# Patient Record
Sex: Male | Born: 1935 | Race: White | Hispanic: No | Marital: Married | Smoking: Former smoker
Health system: Southern US, Community
[De-identification: ages and names within clinical notes are randomized; demographics above are authoritative.]

## PROBLEM LIST (undated history)

## (undated) DIAGNOSIS — C61 Malignant neoplasm of prostate: Secondary | ICD-10-CM

## (undated) DIAGNOSIS — K219 Gastro-esophageal reflux disease without esophagitis: Secondary | ICD-10-CM

## (undated) DIAGNOSIS — I10 Essential (primary) hypertension: Secondary | ICD-10-CM

## (undated) DIAGNOSIS — E785 Hyperlipidemia, unspecified: Secondary | ICD-10-CM

## (undated) HISTORY — DX: Hyperlipidemia, unspecified: E78.5

## (undated) HISTORY — DX: Essential (primary) hypertension: I10

## (undated) HISTORY — DX: Gastro-esophageal reflux disease without esophagitis: K21.9

## (undated) HISTORY — PX: SHOULDER SURGERY: SHX246

## (undated) HISTORY — PX: PROSTATE SURGERY: SHX751

## (undated) HISTORY — PX: ROTATOR CUFF REPAIR: SHX139

## (undated) HISTORY — DX: Malignant neoplasm of prostate: C61

---

## 1997-07-24 ENCOUNTER — Ambulatory Visit (HOSPITAL_COMMUNITY): Admission: RE | Admit: 1997-07-24 | Discharge: 1997-07-24 | Payer: Self-pay | Admitting: Gastroenterology

## 1997-10-23 ENCOUNTER — Ambulatory Visit (HOSPITAL_COMMUNITY): Admission: RE | Admit: 1997-10-23 | Discharge: 1997-10-23 | Payer: Self-pay | Admitting: Gastroenterology

## 1998-03-08 ENCOUNTER — Ambulatory Visit (HOSPITAL_COMMUNITY): Admission: RE | Admit: 1998-03-08 | Discharge: 1998-03-08 | Payer: Self-pay | Admitting: Gastroenterology

## 1999-12-10 ENCOUNTER — Ambulatory Visit (HOSPITAL_COMMUNITY): Admission: RE | Admit: 1999-12-10 | Discharge: 1999-12-10 | Payer: Self-pay | Admitting: Gastroenterology

## 2000-03-18 ENCOUNTER — Observation Stay (HOSPITAL_COMMUNITY): Admission: AD | Admit: 2000-03-18 | Discharge: 2000-03-18 | Payer: Self-pay | Admitting: Urology

## 2006-06-07 HISTORY — PX: CARDIOVASCULAR STRESS TEST: SHX262

## 2010-10-30 ENCOUNTER — Other Ambulatory Visit: Payer: Self-pay | Admitting: Cardiology

## 2010-10-30 NOTE — Telephone Encounter (Signed)
escribe medication per fax request  

## 2010-11-11 ENCOUNTER — Ambulatory Visit
Admission: RE | Admit: 2010-11-11 | Discharge: 2010-11-11 | Disposition: A | Discharge status: Home / Self Care | Payer: Medicare Other | Source: Ambulatory Visit | Attending: Family Medicine | Admitting: Family Medicine

## 2010-11-11 ENCOUNTER — Other Ambulatory Visit: Payer: Self-pay | Admitting: Family Medicine

## 2010-11-11 DIAGNOSIS — M25559 Pain in unspecified hip: Secondary | ICD-10-CM

## 2011-06-16 ENCOUNTER — Encounter: Payer: Self-pay | Admitting: *Deleted

## 2012-05-03 ENCOUNTER — Telehealth: Payer: Self-pay | Admitting: Family Medicine

## 2012-05-03 NOTE — Telephone Encounter (Signed)
Pt request refill atrovastatin.  Med list shows pt on simcor.  Chart states pravachol.  Record sent to provider for clarification.

## 2012-05-11 ENCOUNTER — Telehealth: Payer: Self-pay | Admitting: Family Medicine

## 2012-05-11 MED ORDER — ATENOLOL 25 MG PO TABS: 25.0000 mg | ORAL_TABLET | Freq: Every day | ORAL | Status: DC

## 2012-05-11 NOTE — Telephone Encounter (Signed)
rx refilled.

## 2012-09-02 ENCOUNTER — Ambulatory Visit (INDEPENDENT_AMBULATORY_CARE_PROVIDER_SITE_OTHER): Payer: Medicare Other | Admitting: Family Medicine

## 2012-09-02 ENCOUNTER — Encounter: Payer: Self-pay | Admitting: Family Medicine

## 2012-09-02 VITALS — BP 100/60 | HR 78 | Temp 97.9°F | Resp 16 | Wt 98.0 lb

## 2012-09-02 DIAGNOSIS — T7840XA Allergy, unspecified, initial encounter: Secondary | ICD-10-CM

## 2012-09-02 MED ORDER — PREDNISONE 20 MG PO TABS: ORAL_TABLET | ORAL | Status: DC

## 2012-09-02 NOTE — Progress Notes (Signed)
Subjective:    Patient ID: Todd Riley, male    DOB: 1929-01-26, 79 y.o.   MRN: [id number]  HPI Patient was stung behind his left knee approximately a week and a half ago by some type of flying insect. The area there now is swollen with a red inflammatory papule that itches.  He also complains of swelling and tenderness around the knee when he was stung  Past Medical History  Diagnosis Date  . Hypertension   . Hyperlipidemia   . GERD (gastroesophageal reflux disease)   . Prostate cancer    Current Outpatient Prescriptions on File Prior to Visit  Medication Sig Dispense Refill  . alendronate (FOSAMAX) 70 MG tablet Take 70 mg by mouth every 7 (seven) days. Take with a full glass of water on an empty stomach.      Marland Kitchen aspirin 81 MG tablet Take 81 mg by mouth daily.      Marland Kitchen atenolol (TENORMIN) 25 MG tablet Take 1 tablet (25 mg total) by mouth daily.  90 tablet  1  . calcium carbonate (OS-CAL) 600 MG TABS Take 600 mg by mouth 2 (two) times daily with a meal.      . DIOVAN HCT 160-25 MG per tablet TAKE 1 TABLET BY MOUTH EVERY DAY  30 tablet  3  . Multiple Vitamin (MULTIVITAMIN) tablet Take 1 tablet by mouth daily.      . multivitamin-lutein (OCUVITE-LUTEIN) CAPS Take 1 capsule by mouth daily.      . niacin-simvastatin (SIMCOR) 500-20 MG 24 hr tablet Take 1 tablet by mouth at bedtime.      Marland Kitchen omeprazole (PRILOSEC) 20 MG capsule Take 10 mg by mouth daily.      . psyllium (REGULOID) 0.52 G capsule Take 0.52 g by mouth 3 (three) times a week.       No current facility-administered medications on file prior to visit.   Allergies  Allergen Reactions  . Sulfa Antibiotics    History   Social History  . Marital Status: Single    Spouse Name: N/A    Number of Children: N/A  . Years of Education: N/A   Occupational History  . Not on file.   Social History Main Topics  . Smoking status: Former Smoker    Quit date: 06/16/1994  . Smokeless tobacco: Not on file  . Alcohol Use: No  . Drug  Use: No  . Sexually Active:    Other Topics Concern  . Not on file   Social History Narrative  . No narrative on file      Review of Systems  All other systems reviewed and are negative.       Objective:   Physical Exam  Vitals reviewed. Constitutional: He appears well-developed and well-nourished.  Cardiovascular: Normal rate and regular rhythm.   Pulmonary/Chest: Effort normal and breath sounds normal.  Skin: Skin is warm. Rash noted.   there is a 8 mm red inflammatory papule behind the left knee where the patient was initially stung there is also some surrounding edema. There is no evidence of cellulitis or abscess. The patient also has a corn or a small plantars wart between his fourth and fifth toes on his left foot. Which I recommended he apply Dr. Margart Sickles wart and corn remover to daily for the next 3 weeks to treat.        Assessment & Plan:  1. Allergic reaction, initial encounter Begin prednisone 20 mg tablets. He is to take 3 tablets on  days 1 and 2, 2 tablets on day 3 and 4, 1 tablet on day 5 and 6.  If the lesion persists consider a biopsy.

## 2012-09-27 ENCOUNTER — Encounter: Payer: Self-pay | Admitting: Family Medicine

## 2012-09-27 ENCOUNTER — Ambulatory Visit (INDEPENDENT_AMBULATORY_CARE_PROVIDER_SITE_OTHER): Payer: Medicare Other | Admitting: Family Medicine

## 2012-09-27 VITALS — BP 110/50 | HR 60 | Temp 97.9°F | Resp 14 | Wt 202.0 lb

## 2012-09-27 DIAGNOSIS — M25562 Pain in left knee: Secondary | ICD-10-CM

## 2012-09-27 DIAGNOSIS — M25569 Pain in unspecified knee: Secondary | ICD-10-CM

## 2012-09-27 MED ORDER — MELOXICAM 15 MG PO TABS: 15.0000 mg | ORAL_TABLET | Freq: Every day | ORAL | Status: DC

## 2012-09-27 NOTE — Progress Notes (Signed)
Subjective:    Patient ID: Todd Riley, male    DOB: 1928/10/06, 79 y.o.   MRN: [id number]  HPI Patient presents with a month of worsening left knee pain. It is primarily on the lateral aspect of his joint. It hurts with cramping and prolonged standing. He also reports occasional locking of the knee joint. He denies any joint laxity. It was irritated while he was walking [place] the beach on uneven sand.  He denies any erythema or effusion.  Past Medical History  Diagnosis Date  . Hypertension   . Hyperlipidemia   . GERD (gastroesophageal reflux disease)   . Prostate cancer    Current Outpatient Prescriptions on File Prior to Visit  Medication Sig Dispense Refill  . aspirin 81 MG tablet Take 81 mg by mouth daily.      Marland Kitchen atenolol (TENORMIN) 25 MG tablet Take 1 tablet (25 mg total) by mouth daily.  90 tablet  1  . calcium carbonate (OS-CAL) 600 MG TABS Take 600 mg by mouth 2 (two) times daily with a meal.      . DIOVAN HCT 160-25 MG per tablet TAKE 1 TABLET BY MOUTH EVERY DAY  30 tablet  3  . Multiple Vitamin (MULTIVITAMIN) tablet Take 1 tablet by mouth daily.      . multivitamin-lutein (OCUVITE-LUTEIN) CAPS Take 1 capsule by mouth daily.      Marland Kitchen omeprazole (PRILOSEC) 20 MG capsule Take 10 mg by mouth daily.      . psyllium (REGULOID) 0.52 G capsule Take 0.52 g by mouth 3 (three) times a week.      . Triptorelin Pamoate (TRELSTAR MIXJECT) 22.5 MG injection Inject 22.5 mg into the muscle every 6 (six) months.       No current facility-administered medications on file prior to visit.   Allergies  Allergen Reactions  . Sulfa Antibiotics    History   Social History  . Marital Status: Single    Spouse Name: N/A    Number of Children: N/A  . Years of Education: N/A   Occupational History  . Not on file.   Social History Main Topics  . Smoking status: Former Smoker    Quit date: 06/16/1994  . Smokeless tobacco: Not on file  . Alcohol Use: No  . Drug Use: No  . Sexually Active:     Other Topics Concern  . Not on file   Social History Narrative  . No narrative on file   No family history on file.    Review of Systems  All other systems reviewed and are negative.       Objective:   Physical Exam  Vitals reviewed. Cardiovascular: Normal rate and regular rhythm.   Pulmonary/Chest: Effort normal and breath sounds normal.  Musculoskeletal:       Left knee: He exhibits abnormal meniscus. He exhibits normal range of motion, no swelling, no effusion, normal alignment, no LCL laxity, normal patellar mobility and no MCL laxity. Tenderness found. Medial joint line and lateral joint line tenderness noted. No MCL, no LCL and no patellar tendon tenderness noted.          Assessment & Plan:  1. Knee pain, acute, left Discussed different options for treatment. I suspect arthritis compounded by lateral meniscal tear. Patient lacks meloxicam 15 mg by mouth daily. Recheck in 2 weeks. I recommended resting and icing the knee every day. If the knee is no better we can consider a cortisone injection versus obtaining an MRI to evaluate  for meniscal tear. - meloxicam (MOBIC) 15 MG tablet; Take 1 tablet (15 mg total) by mouth daily.  Dispense: 30 tablet; Refill: 0

## 2012-11-01 ENCOUNTER — Ambulatory Visit (INDEPENDENT_AMBULATORY_CARE_PROVIDER_SITE_OTHER): Payer: Medicare Other | Admitting: Family Medicine

## 2012-11-01 DIAGNOSIS — Z23 Encounter for immunization: Secondary | ICD-10-CM

## 2012-11-07 ENCOUNTER — Other Ambulatory Visit: Payer: Self-pay | Admitting: Family Medicine

## 2012-11-07 NOTE — Telephone Encounter (Signed)
Medication refilled per protocol. 

## 2012-11-28 ENCOUNTER — Encounter: Payer: Medicare Other | Admitting: Family Medicine

## 2012-11-29 ENCOUNTER — Other Ambulatory Visit: Payer: Medicare Other

## 2012-11-29 DIAGNOSIS — M810 Age-related osteoporosis without current pathological fracture: Secondary | ICD-10-CM

## 2012-11-29 DIAGNOSIS — Z8546 Personal history of malignant neoplasm of prostate: Secondary | ICD-10-CM

## 2012-11-29 DIAGNOSIS — E785 Hyperlipidemia, unspecified: Secondary | ICD-10-CM

## 2012-11-29 DIAGNOSIS — Z79899 Other long term (current) drug therapy: Secondary | ICD-10-CM

## 2012-11-29 DIAGNOSIS — I10 Essential (primary) hypertension: Secondary | ICD-10-CM

## 2012-11-29 DIAGNOSIS — Z0000 Encounter for general adult medical examination without abnormal findings: Secondary | ICD-10-CM

## 2012-11-29 LAB — COMPLETE METABOLIC PANEL WITH GFR
ALT: 21 U/L (ref 0–53)
Albumin: 4 g/dL (ref 3.5–5.2)
Alkaline Phosphatase: 40 U/L (ref 39–117)
BUN: 22 mg/dL (ref 6–23)
GFR, Est African American: 60 mL/min
GFR, Est Non African American: 52 mL/min — ABNORMAL LOW
Glucose, Bld: 92 mg/dL (ref 70–99)
Potassium: 5 mEq/L (ref 3.5–5.3)
Sodium: 138 mEq/L (ref 135–145)
Total Protein: 6.6 g/dL (ref 6.0–8.3)

## 2012-11-29 LAB — CBC WITH DIFFERENTIAL/PLATELET
Basophils Absolute: 0.1 10*3/uL (ref 0.0–0.1)
Basophils Relative: 1 % (ref 0–1)
HCT: 39.4 % (ref 39.0–52.0)
Hemoglobin: 13.5 g/dL (ref 13.0–17.0)
Lymphocytes Relative: 21 % (ref 12–46)
Monocytes Absolute: 0.9 10*3/uL (ref 0.1–1.0)
Neutro Abs: 4.2 10*3/uL (ref 1.7–7.7)
Neutrophils Relative %: 60 % (ref 43–77)
RDW: 12.8 % (ref 11.5–15.5)
WBC: 7 10*3/uL (ref 4.0–10.5)

## 2012-11-29 LAB — TSH: TSH: 0.921 u[IU]/mL (ref 0.350–4.500)

## 2012-11-29 LAB — LIPID PANEL
HDL: 43 mg/dL (ref >39)
LDL Cholesterol: 115 mg/dL — ABNORMAL HIGH (ref 0–99)
VLDL: 22 mg/dL (ref 0–40)

## 2012-11-29 LAB — PSA, MEDICARE: PSA: 0.24 ng/mL (ref <=4.00)

## 2012-12-08 ENCOUNTER — Ambulatory Visit (INDEPENDENT_AMBULATORY_CARE_PROVIDER_SITE_OTHER): Payer: Medicare Other | Admitting: Family Medicine

## 2012-12-08 ENCOUNTER — Encounter: Payer: Self-pay | Admitting: Family Medicine

## 2012-12-08 VITALS — BP 120/80 | HR 78 | Temp 97.1°F | Resp 18 | Ht 70.0 in | Wt 198.0 lb

## 2012-12-08 DIAGNOSIS — Z0000 Encounter for general adult medical examination without abnormal findings: Secondary | ICD-10-CM

## 2012-12-08 NOTE — Progress Notes (Signed)
Subjective:    Patient ID: Todd Riley, male    DOB: 1929/01/21, 79 y.o.   MRN: [id number]  HPI Patient is here for complete physical exam. He has no major medical concerns. The patient has had a pneumonia vaccine. He does have a Zostavax. His last tetanus shot was in 2005. He received his flu shot this morning. He has a history of prostate cancer. His PSA was greater than 6 in September 2013. At that time he switched to trelstar.  Since then his PSA has been 0.14 in January and is 0.24 today in the office. Otherwise he is asymptomatic and feeling well. His most recent lab work is listed: Lab on 11/29/2012  Component Date Value Range Status  . Cholesterol 11/29/2012 180  0 - 200 mg/dL Final   Comment: ATP III Classification:                                < 200        mg/dL        Desirable                               200 - 239     mg/dL        Borderline High                               >= 240        mg/dL        High                             . Triglycerides 11/29/2012 109  <150 mg/dL Final  . HDL 47/42/5956 43  >39 mg/dL Final  . Total CHOL/HDL Ratio 11/29/2012 4.2   Final  . VLDL 11/29/2012 22  0 - 40 mg/dL Final  . LDL Cholesterol 11/29/2012 115* 0 - 99 mg/dL Final   Comment:                            Total Cholesterol/HDL Ratio:CHD Risk                                                 Coronary Heart Disease Risk Table                                                                 Men       Women                                   1/2 Average Risk              3.4        3.3  Average Risk              5.0        4.4                                    2X Average Risk              9.6        7.1                                    3X Average Risk             23.4       11.0                          Use the calculated Patient Ratio above and the CHD Risk table                           to determine the patient's CHD Risk.         ATP III Classification (LDL):                                < 100        mg/dL         Optimal                               100 - 129     mg/dL         Near or Above Optimal                               130 - 159     mg/dL         Borderline High                               160 - 189     mg/dL         High                                > 190        mg/dL         Very High                             . WBC 11/29/2012 7.0  4.0 - 10.5 K/uL Final  . RBC 11/29/2012 4.44  4.22 - 5.81 MIL/uL Final  . Hemoglobin 11/29/2012 13.5  13.0 - 17.0 g/dL Final  . HCT 24/40/1027 39.4  39.0 - 52.0 % Final  . MCV 11/29/2012 88.7  78.0 - 100.0 fL Final  . MCH 11/29/2012 30.4  26.0 - 34.0 pg Final  . MCHC 11/29/2012 34.3  30.0 - 36.0 g/dL Final  . RDW 25/36/6440 12.8  11.5 - 15.5 % Final  . Platelets 11/29/2012 195  150 - 400 K/uL Final  . Neutrophils Relative % 11/29/2012  60  43 - 77 % Final  . Neutro Abs 11/29/2012 4.2  1.7 - 7.7 K/uL Final  . Lymphocytes Relative 11/29/2012 21  12 - 46 % Final  . Lymphs Abs 11/29/2012 1.4  0.7 - 4.0 K/uL Final  . Monocytes Relative 11/29/2012 12  3 - 12 % Final  . Monocytes Absolute 11/29/2012 0.9  0.1 - 1.0 K/uL Final  . Eosinophils Relative 11/29/2012 6* 0 - 5 % Final  . Eosinophils Absolute 11/29/2012 0.4  0.0 - 0.7 K/uL Final  . Basophils Relative 11/29/2012 1  0 - 1 % Final  . Basophils Absolute 11/29/2012 0.1  0.0 - 0.1 K/uL Final  . Smear Review 11/29/2012 Criteria for review not met   Final  . TSH 11/29/2012 0.921  0.350 - 4.500 uIU/mL Final  . Vit D, 25-Hydroxy 11/29/2012 52  30 - 89 ng/mL Final   Comment: This assay accurately quantifies Vitamin D, which is the sum of the                          25-Hydroxy forms of Vitamin D2 and D3.  Studies have shown that the                          optimum concentration of 25-Hydroxy Vitamin D is 30 ng/mL or higher.                           Concentrations of Vitamin D between 20 and 29 ng/mL are considered  to                          be insufficient and concentrations less than 20 ng/mL are considered                          to be deficient for Vitamin D.  . PSA 11/29/2012 0.24  <=4.00 ng/mL Final   Comment: Result repeated and verified.                          Test Methodology: ECLIA PSA (Electrochemiluminescence Immunoassay)                                                     For PSA values from 2.5-4.0, particularly in younger men <60 years                          old, the AUA and NCCN suggest testing for % Free PSA (3515) and                          evaluation of the rate of increase in PSA (PSA velocity).  . Sodium 11/29/2012 138  135 - 145 mEq/L Final  . Potassium 11/29/2012 5.0  3.5 - 5.3 mEq/L Final  . Chloride 11/29/2012 102  96 - 112 mEq/L Final  . CO2 11/29/2012 32  19 - 32 mEq/L Final  . Glucose, Bld 11/29/2012 92  70 - 99 mg/dL Final  . BUN 16/11/9602 22  6 - 23 mg/dL Final  . Creat 54/10/8117 1.27  0.50 - 1.35 mg/dL Final  . Total Bilirubin 11/29/2012 0.6  0.3 - 1.2 mg/dL Final  . Alkaline Phosphatase 11/29/2012 40  39 - 117 U/L Final  . AST 11/29/2012 28  0 - 37 U/L Final  . ALT 11/29/2012 21  0 - 53 U/L Final  . Total Protein 11/29/2012 6.6  6.0 - 8.3 g/dL Final  . Albumin 16/11/9602 4.0  3.5 - 5.2 g/dL Final  . Calcium 54/10/8117 9.5  8.4 - 10.5 mg/dL Final  . GFR, Est African American 11/29/2012 60   Final  . GFR, Est Non African American 11/29/2012 52*  Final   Comment:                            The estimated GFR is a calculation valid for adults (>=60 years old)                          that uses the CKD-EPI algorithm to adjust for age and sex. It is                            not to be used for children, pregnant women, hospitalized patients,                             patients on dialysis, or with rapidly changing kidney function.                          According to the NKDEP, eGFR >89 is normal, 60-89 shows mild                          impairment, 30-59  shows moderate impairment, 15-29 shows severe                          impairment and <15 is ESRD.                              Past Medical History  Diagnosis Date  . Hypertension   . Hyperlipidemia   . GERD (gastroesophageal reflux disease)   . Prostate cancer    Past Surgical History  Procedure Laterality Date  . Rotator cuff repair    . Shoulder surgery    . Prostate surgery    . Cardiovascular stress test  06/07/2006    ef 56%, no ischemia   Current Outpatient Prescriptions on File Prior to Visit  Medication Sig Dispense Refill  . aspirin 81 MG tablet Take 81 mg by mouth daily.      Marland Kitchen atenolol (TENORMIN) 25 MG tablet TAKE 1 TABLET EVERY DAY  90 tablet  1  . calcium carbonate (OS-CAL) 600 MG TABS Take 600 mg by mouth 2 (two) times daily with a meal.      . DIOVAN HCT 160-25 MG per tablet TAKE 1 TABLET BY MOUTH EVERY DAY  30 tablet  3  . ezetimibe (ZETIA) 10 MG tablet Take 10 mg by mouth daily.      . Multiple Vitamin (MULTIVITAMIN) tablet Take 1 tablet by mouth daily.      . multivitamin-lutein (OCUVITE-LUTEIN) CAPS Take 1 capsule by mouth daily.      Marland Kitchen  omeprazole (PRILOSEC) 20 MG capsule Take 10 mg by mouth daily.      . psyllium (REGULOID) 0.52 G capsule Take 0.52 g by mouth 3 (three) times a week.      . Triptorelin Pamoate (TRELSTAR MIXJECT) 22.5 MG injection Inject 22.5 mg into the muscle every 6 (six) months.       No current facility-administered medications on file prior to visit.   Allergies  Allergen Reactions  . Sulfa Antibiotics    History   Social History  . Marital Status: Single    Spouse Name: N/A    Number of Children: N/A  . Years of Education: N/A   Occupational History  . Not on file.   Social History Main Topics  . Smoking status: Former Smoker    Quit date: 06/16/1994  . Smokeless tobacco: Not on file  . Alcohol Use: No  . Drug Use: No  . Sexual Activity:    Other Topics Concern  . Not on file   Social History Narrative  . No  narrative on file   Family history is noncontributory. The patient is married to [place].  He is retired.     Review of Systems  All other systems reviewed and are negative.       Objective:   Physical Exam  Vitals reviewed. Constitutional: He is oriented to person, place, and time. He appears well-developed and well-nourished. No distress.  HENT:  Head: Normocephalic and atraumatic.  Right Ear: External ear normal.  Left Ear: External ear normal.  Nose: Nose normal.  Mouth/Throat: Oropharynx is clear and moist. No oropharyngeal exudate.  Eyes: Conjunctivae and EOM are normal. Pupils are equal, round, and reactive to light. Right eye exhibits no discharge. Left eye exhibits no discharge. No scleral icterus.  Neck: Normal range of motion. Neck supple. No JVD present. No tracheal deviation present. No thyromegaly present.  Cardiovascular: Normal rate, regular rhythm, normal heart sounds and intact distal pulses.  Exam reveals no gallop and no friction rub.   No murmur heard. Pulmonary/Chest: Effort normal and breath sounds normal. No stridor. No respiratory distress. He has no wheezes. He has no rales. He exhibits no tenderness.  Abdominal: Soft. Bowel sounds are normal. He exhibits no distension and no mass. There is no tenderness. There is no rebound and no guarding.  Musculoskeletal: Normal range of motion. He exhibits no edema and no tenderness.  Lymphadenopathy:    He has no cervical adenopathy.  Neurological: He is alert and oriented to person, place, and time. He has normal reflexes. He displays normal reflexes. No cranial nerve deficit. He exhibits normal muscle tone. Coordination normal.  Skin: Skin is warm. No rash noted. He is not diaphoretic. There is erythema. No pallor.  Psychiatric: He has a normal mood and affect. His behavior is normal. Judgment and thought content normal.          Assessment & Plan:  1. Routine general medical examination at a health care  facility Patient's physical exam and lab work and blood pressure are excellent. His immunizations are up-to-date. His cancer screening is up to date. I do see 2 actinic keratoses versus squamous cell carcinomas on his left temple. I recommended he return at a time this convenient for him for cryotherapy. He states that he would like to do this in the future. Otherwise general preventative care was discussed including healthy diet, and regular aerobic exercise.

## 2012-12-26 ENCOUNTER — Other Ambulatory Visit: Payer: Self-pay | Admitting: Family Medicine

## 2013-01-05 ENCOUNTER — Ambulatory Visit (INDEPENDENT_AMBULATORY_CARE_PROVIDER_SITE_OTHER): Payer: Medicare Other | Admitting: Family Medicine

## 2013-01-05 ENCOUNTER — Encounter: Payer: Self-pay | Admitting: Family Medicine

## 2013-01-05 VITALS — BP 110/60 | HR 72 | Temp 97.6°F | Resp 16 | Ht 71.0 in | Wt 201.0 lb

## 2013-01-05 DIAGNOSIS — D485 Neoplasm of uncertain behavior of skin: Secondary | ICD-10-CM

## 2013-01-05 NOTE — Progress Notes (Signed)
Subjective:    Patient ID: Todd Riley, male    DOB: 1928-11-15, 79 y.o.   MRN: [id number]  HPI  Patient presents today concerned about 3 lesions on his skin. 2 on his left temple. One is on his right forearm. There is a 1 cm erythematous scaly papule on his left temple. There is a 1 cm erythematous scaly macule on his left temple.  There is a 7 mm erythematous scaly papule on his right forearm. The lesions are not itchy, they're not painful, they do not bleed. Past Medical History  Diagnosis Date  . Hypertension   . Hyperlipidemia   . GERD (gastroesophageal reflux disease)   . Prostate cancer    Past Surgical History  Procedure Laterality Date  . Rotator cuff repair    . Shoulder surgery    . Prostate surgery    . Cardiovascular stress test  06/07/2006    ef 56%, no ischemia   Current Outpatient Prescriptions on File Prior to Visit  Medication Sig Dispense Refill  . aspirin 81 MG tablet Take 81 mg by mouth daily.      Marland Kitchen atenolol (TENORMIN) 25 MG tablet TAKE 1 TABLET EVERY DAY  90 tablet  1  . calcium carbonate (OS-CAL) 600 MG TABS Take 600 mg by mouth 2 (two) times daily with a meal.      . ezetimibe (ZETIA) 10 MG tablet Take 10 mg by mouth daily.      . Multiple Vitamin (MULTIVITAMIN) tablet Take 1 tablet by mouth daily.      . multivitamin-lutein (OCUVITE-LUTEIN) CAPS Take 1 capsule by mouth daily.      Marland Kitchen omeprazole (PRILOSEC) 20 MG capsule Take 10 mg by mouth daily.      . psyllium (REGULOID) 0.52 G capsule Take 0.52 g by mouth 3 (three) times a week.      . Triptorelin Pamoate (TRELSTAR MIXJECT) 22.5 MG injection Inject 22.5 mg into the muscle every 6 (six) months.      . valsartan-hydrochlorothiazide (DIOVAN-HCT) 160-25 MG per tablet TAKE 1 TABLET BY MOUTH DAILY  90 tablet  3   No current facility-administered medications on file prior to visit.   Allergies  Allergen Reactions  . Sulfa Antibiotics    History   Social History  . Marital Status: Single    Spouse  Name: N/A    Number of Children: N/A  . Years of Education: N/A   Occupational History  . Not on file.   Social History Main Topics  . Smoking status: Former Smoker    Quit date: 06/16/1994  . Smokeless tobacco: Not on file  . Alcohol Use: No  . Drug Use: No  . Sexual Activity:    Other Topics Concern  . Not on file   Social History Narrative  . No narrative on file     Review of Systems  All other systems reviewed and are negative.       Objective:   Physical Exam  Vitals reviewed. Cardiovascular: Normal rate and regular rhythm.   Pulmonary/Chest: Effort normal and breath sounds normal.   please see the description of the lesions in the history of present illness        Assessment & Plan:  1. Neoplasm of uncertain behavior of skin I am concerned all 3 are actinic keratoses versus early squamous cell carcinomas. All 3 lesions were each treated with liquid nitrogen cryotherapy for a total of 30 seconds each. The patient was informed that if the  lesions persist greater than one month I would consult dermatology for surgical excision

## 2013-04-30 ENCOUNTER — Other Ambulatory Visit: Payer: Self-pay | Admitting: Family Medicine

## 2013-10-26 ENCOUNTER — Other Ambulatory Visit: Payer: Self-pay | Admitting: Family Medicine

## 2013-11-09 ENCOUNTER — Other Ambulatory Visit (HOSPITAL_COMMUNITY): Payer: Self-pay | Admitting: Urology

## 2013-11-09 DIAGNOSIS — C61 Malignant neoplasm of prostate: Secondary | ICD-10-CM

## 2013-11-13 ENCOUNTER — Ambulatory Visit: Payer: Medicare Other | Admitting: *Deleted

## 2013-11-13 VITALS — BP 130/68 | HR 62 | Temp 98.3°F | Resp 14

## 2013-11-13 DIAGNOSIS — I10 Essential (primary) hypertension: Secondary | ICD-10-CM | POA: Insufficient documentation

## 2013-11-13 NOTE — Progress Notes (Signed)
Patient ID: Todd Riley, male   DOB: 09/11/1928, 80 y.o.   MRN: [id number] Patient seen in office to have BP checked.   Reports that he was in drug store on 11/12/2013 and checked his BP with automated cuff. Reports that reading was 94/55. Denies any dizziness or feelings of weakness.   BP noted 130/68 today. Reports that that is near his baseline.   Advised to retrn or contact office if BP drops or if he has any unexplained weakness or dizziness.

## 2013-11-22 ENCOUNTER — Encounter (HOSPITAL_COMMUNITY): Payer: Medicare Other

## 2013-11-27 ENCOUNTER — Encounter (HOSPITAL_COMMUNITY)
Admission: RE | Admit: 2013-11-27 | Discharge: 2013-11-27 | Disposition: A | Discharge status: Home / Self Care | Payer: Medicare Other | Source: Ambulatory Visit | Attending: Diagnostic Radiology | Admitting: Diagnostic Radiology

## 2013-11-27 ENCOUNTER — Encounter (HOSPITAL_COMMUNITY)
Admission: RE | Admit: 2013-11-27 | Discharge: 2013-11-27 | Disposition: A | Discharge status: Home / Self Care | Payer: Medicare Other | Source: Ambulatory Visit | Attending: Urology | Admitting: Urology

## 2013-11-27 DIAGNOSIS — C61 Malignant neoplasm of prostate: Secondary | ICD-10-CM | POA: Insufficient documentation

## 2013-11-27 DIAGNOSIS — R972 Elevated prostate specific antigen [PSA]: Secondary | ICD-10-CM | POA: Insufficient documentation

## 2013-11-27 MED ORDER — TECHNETIUM TC 99M MEDRONATE IV KIT
24.7000 | PACK | Freq: Once | INTRAVENOUS | Status: AC | PRN
  Administered 2013-11-27: 24.7 via INTRAVENOUS

## 2013-11-28 ENCOUNTER — Other Ambulatory Visit: Payer: Self-pay | Admitting: Family Medicine

## 2013-12-07 ENCOUNTER — Other Ambulatory Visit: Payer: Medicare Other

## 2013-12-07 DIAGNOSIS — E785 Hyperlipidemia, unspecified: Secondary | ICD-10-CM

## 2013-12-07 DIAGNOSIS — Z0000 Encounter for general adult medical examination without abnormal findings: Secondary | ICD-10-CM

## 2013-12-07 DIAGNOSIS — Z79899 Other long term (current) drug therapy: Secondary | ICD-10-CM

## 2013-12-07 DIAGNOSIS — Z8546 Personal history of malignant neoplasm of prostate: Secondary | ICD-10-CM

## 2013-12-07 DIAGNOSIS — K219 Gastro-esophageal reflux disease without esophagitis: Secondary | ICD-10-CM

## 2013-12-07 DIAGNOSIS — I10 Essential (primary) hypertension: Secondary | ICD-10-CM

## 2013-12-07 LAB — COMPLETE METABOLIC PANEL WITH GFR
ALBUMIN: 3.9 g/dL (ref 3.5–5.2)
ALT: 17 U/L (ref 0–53)
AST: 27 U/L (ref 0–37)
Alkaline Phosphatase: 38 U/L — ABNORMAL LOW (ref 39–117)
BUN: 22 mg/dL (ref 6–23)
CALCIUM: 9.4 mg/dL (ref 8.4–10.5)
CHLORIDE: 102 meq/L (ref 96–112)
CO2: 29 mEq/L (ref 19–32)
Creat: 1.38 mg/dL — ABNORMAL HIGH (ref 0.50–1.35)
GFR, Est African American: 54 mL/min — ABNORMAL LOW
GFR, Est Non African American: 47 mL/min — ABNORMAL LOW
Glucose, Bld: 90 mg/dL (ref 70–99)
POTASSIUM: 4.7 meq/L (ref 3.5–5.3)
Sodium: 139 mEq/L (ref 135–145)
Total Bilirubin: 0.7 mg/dL (ref 0.2–1.2)
Total Protein: 6.8 g/dL (ref 6.0–8.3)

## 2013-12-07 LAB — TSH: TSH: 1.097 u[IU]/mL (ref 0.350–4.500)

## 2013-12-07 LAB — CBC WITH DIFFERENTIAL/PLATELET
BASOS PCT: 1 % (ref 0–1)
Basophils Absolute: 0.1 10*3/uL (ref 0.0–0.1)
EOS ABS: 0.3 10*3/uL (ref 0.0–0.7)
Eosinophils Relative: 5 % (ref 0–5)
HCT: 39.5 % (ref 39.0–52.0)
Hemoglobin: 13.2 g/dL (ref 13.0–17.0)
Lymphocytes Relative: 21 % (ref 12–46)
Lymphs Abs: 1.4 10*3/uL (ref 0.7–4.0)
MCH: 30.1 pg (ref 26.0–34.0)
MCHC: 33.4 g/dL (ref 30.0–36.0)
MCV: 90.2 fL (ref 78.0–100.0)
Monocytes Absolute: 0.6 10*3/uL (ref 0.1–1.0)
Monocytes Relative: 9 % (ref 3–12)
NEUTROS PCT: 64 % (ref 43–77)
Neutro Abs: 4.2 10*3/uL (ref 1.7–7.7)
Platelets: 216 10*3/uL (ref 150–400)
RBC: 4.38 MIL/uL (ref 4.22–5.81)
RDW: 13.3 % (ref 11.5–15.5)
WBC: 6.5 10*3/uL (ref 4.0–10.5)

## 2013-12-07 LAB — LIPID PANEL
CHOLESTEROL: 186 mg/dL (ref 0–200)
HDL: 35 mg/dL — ABNORMAL LOW (ref >39)
LDL Cholesterol: 122 mg/dL — ABNORMAL HIGH (ref 0–99)
Total CHOL/HDL Ratio: 5.3 Ratio
Triglycerides: 144 mg/dL (ref <150)
VLDL: 29 mg/dL (ref 0–40)

## 2013-12-08 LAB — VITAMIN D 25 HYDROXY (VIT D DEFICIENCY, FRACTURES): VIT D 25 HYDROXY: 55 ng/mL (ref 30–89)

## 2013-12-08 LAB — PSA, MEDICARE: PSA: 11.47 ng/mL — ABNORMAL HIGH (ref <=4.00)

## 2013-12-11 ENCOUNTER — Other Ambulatory Visit: Payer: Medicare Other

## 2013-12-14 ENCOUNTER — Encounter: Payer: Self-pay | Admitting: Family Medicine

## 2013-12-14 ENCOUNTER — Ambulatory Visit (INDEPENDENT_AMBULATORY_CARE_PROVIDER_SITE_OTHER): Payer: Medicare Other | Admitting: Family Medicine

## 2013-12-14 VITALS — BP 110/58 | HR 60 | Temp 97.5°F | Resp 14 | Ht 71.0 in | Wt 201.0 lb

## 2013-12-14 DIAGNOSIS — Z0000 Encounter for general adult medical examination without abnormal findings: Secondary | ICD-10-CM

## 2013-12-14 NOTE — Progress Notes (Signed)
Subjective:    Patient ID: Todd Riley, male    DOB: 1928-04-20, 80 y.o.   MRN: [id number]  HPI Subjective:   Patient presents for Medicare Annual/Subsequent preventive examination.  Patient's PSA has recently risen. He has a history of prostate cancer. He has decided with his urologist to resume lupron injections. His most recent lab work as listed below: Lab on 12/07/2013  Component Date Value Ref Range Status  . WBC 12/07/2013 6.5  4.0 - 10.5 K/uL Final  . RBC 12/07/2013 4.38  4.22 - 5.81 MIL/uL Final  . Hemoglobin 12/07/2013 13.2  13.0 - 17.0 g/dL Final  . HCT 12/07/2013 39.5  39.0 - 52.0 % Final  . MCV 12/07/2013 90.2  78.0 - 100.0 fL Final  . MCH 12/07/2013 30.1  26.0 - 34.0 pg Final  . MCHC 12/07/2013 33.4  30.0 - 36.0 g/dL Final  . RDW 12/07/2013 13.3  11.5 - 15.5 % Final  . Platelets 12/07/2013 216  150 - 400 K/uL Final  . Neutrophils Relative % 12/07/2013 64  43 - 77 % Final  . Neutro Abs 12/07/2013 4.2  1.7 - 7.7 K/uL Final  . Lymphocytes Relative 12/07/2013 21  12 - 46 % Final  . Lymphs Abs 12/07/2013 1.4  0.7 - 4.0 K/uL Final  . Monocytes Relative 12/07/2013 9  3 - 12 % Final  . Monocytes Absolute 12/07/2013 0.6  0.1 - 1.0 K/uL Final  . Eosinophils Relative 12/07/2013 5  0 - 5 % Final  . Eosinophils Absolute 12/07/2013 0.3  0.0 - 0.7 K/uL Final  . Basophils Relative 12/07/2013 1  0 - 1 % Final  . Basophils Absolute 12/07/2013 0.1  0.0 - 0.1 K/uL Final  . Smear Review 12/07/2013 Criteria for review not met   Final  . Cholesterol 12/07/2013 186  0 - 200 mg/dL Final   Comment: ATP III Classification:                                < 200        mg/dL        Desirable                               200 - 239     mg/dL        Borderline High                               >= 240        mg/dL        High                             . Triglycerides 12/07/2013 144  <150 mg/dL Final  . HDL 12/07/2013 35* >39 mg/dL Final  . Total CHOL/HDL Ratio 12/07/2013 5.3   Final  .  VLDL 12/07/2013 29  0 - 40 mg/dL Final  . LDL Cholesterol 12/07/2013 122* 0 - 99 mg/dL Final   Comment:                            Total Cholesterol/HDL Ratio:CHD Risk  Coronary Heart Disease Risk Table                                                                 Men       Women                                   1/2 Average Risk              3.4        3.3                                       Average Risk              5.0        4.4                                    2X Average Risk              9.6        7.1                                    3X Average Risk             23.4       11.0                          Use the calculated Patient Ratio above and the CHD Risk table                           to determine the patient's CHD Risk.                          ATP III Classification (LDL):                                < 100        mg/dL         Optimal                               100 - 129     mg/dL         Near or Above Optimal                               130 - 159     mg/dL         Borderline High                               160 - 189     mg/dL           High                                > 190        mg/dL         Very High                             . PSA 12/07/2013 11.47* <=4.00 ng/mL Final   Comment: Test Methodology: ECLIA PSA (Electrochemiluminescence Immunoassay)                                                     For PSA values from 2.5-4.0, particularly in younger men <60 years                          old, the AUA and NCCN suggest testing for % Free PSA (3515) and                          evaluation of the rate of increase in PSA (PSA velocity).  . TSH 12/07/2013 1.097  0.350 - 4.500 uIU/mL Final  . Vit D, 25-Hydroxy 12/07/2013 55  30 - 89 ng/mL Final   Comment: This assay accurately quantifies Vitamin D, which is the sum of the                          25-Hydroxy forms of Vitamin D2 and D3.  Studies have shown that the                           optimum concentration of 25-Hydroxy Vitamin D is 30 ng/mL or higher.                           Concentrations of Vitamin D between 20 and 29 ng/mL are considered to                          be insufficient and concentrations less than 20 ng/mL are considered                          to be deficient for Vitamin D.  . Sodium 12/07/2013 139  135 - 145 mEq/L Final  . Potassium 12/07/2013 4.7  3.5 - 5.3 mEq/L Final  . Chloride 12/07/2013 102  96 - 112 mEq/L Final  . CO2 12/07/2013 29  19 - 32 mEq/L Final  . Glucose, Bld 12/07/2013 90  70 - 99 mg/dL Final  . BUN 59/43/9621 22  6 - 23 mg/dL Final  . Creat 81/96/8279 1.38* 0.50 - 1.35 mg/dL Final  . Total Bilirubin 12/07/2013 0.7  0.2 - 1.2 mg/dL Final  . Alkaline Phosphatase 12/07/2013 38* 39 - 117 U/L Final  . AST 12/07/2013 27  0 - 37 U/L Final  . ALT 12/07/2013 17  0 - 53 U/L Final  . Total Protein 12/07/2013 6.8  6.0 - 8.3 g/dL Final  . Albumin 96/57/3801 3.9  3.5 - 5.2 g/dL Final  . Calcium 30/17/5120 9.4  8.4 - 10.5 mg/dL Final  . GFR, Est African American 12/07/2013 54*  Final  . GFR, Est Non African American 12/07/2013 47*  Final   Comment:                            The estimated GFR is a calculation valid for adults (>=38 years old)                          that uses the CKD-EPI algorithm to adjust for age and sex. It is                            not to be used for children, pregnant women, hospitalized patients,                             patients on dialysis, or with rapidly changing kidney function.                          According to the NKDEP, eGFR >89 is normal, 60-89 shows mild                          impairment, 30-59 shows moderate impairment, 15-29 shows severe                          impairment and <15 is ESRD.                                 Review Past Medical/Family/Social:  Past Medical History  Diagnosis Date  . Hypertension   . Hyperlipidemia   . GERD (gastroesophageal reflux  disease)   . Prostate cancer    Past Surgical History  Procedure Laterality Date  . Rotator cuff repair    . Shoulder surgery    . Prostate surgery    . Cardiovascular stress test  06/07/2006    ef 56%, no ischemia   Current Outpatient Prescriptions on File Prior to Visit  Medication Sig Dispense Refill  . aspirin 81 MG tablet Take 81 mg by mouth daily.      Marland Kitchen atenolol (TENORMIN) 25 MG tablet TAKE 1 TABLET BY MOUTH DAILY  30 tablet  1  . calcium carbonate (OS-CAL) 600 MG TABS Take 600 mg by mouth 2 (two) times daily with a meal.      . ezetimibe (ZETIA) 10 MG tablet Take 10 mg by mouth daily.      . Multiple Vitamin (MULTIVITAMIN) tablet Take 1 tablet by mouth daily.      . multivitamin-lutein (OCUVITE-LUTEIN) CAPS Take 1 capsule by mouth daily.      Marland Kitchen omeprazole (PRILOSEC) 20 MG capsule Take 10 mg by mouth daily.      . psyllium (REGULOID) 0.52 G capsule Take 0.52 g by mouth 3 (three) times a week.      . valsartan-hydrochlorothiazide (DIOVAN-HCT) 160-25 MG per tablet TAKE 1 TABLET BY MOUTH DAILY  90 tablet  3   No current facility-administered medications on file prior to visit.   Allergies  Allergen Reactions  . Sulfa Antibiotics    History   Social History  . Marital Status: Single    Spouse  Name: N/A    Number of Children: N/A  . Years of Education: N/A   Occupational History  . Not on file.   Social History Main Topics  . Smoking status: Former Smoker    Quit date: 06/16/1994  . Smokeless tobacco: Not on file  . Alcohol Use: No  . Drug Use: No  . Sexual Activity:    Other Topics Concern  . Not on file   Social History Narrative  . No narrative on file   No family history on file.  Depression Screen  (Note: if answer to either of the following is "Yes", a more complete depression screening is indicated)  Over the past two weeks, have you felt down, depressed or hopeless? No Over the past two weeks, have you felt little interest or pleasure in doing  things? No Have you lost interest or pleasure in daily life? No Do you often feel hopeless? No Do you cry easily over simple problems? No   Activities of Daily Living  In your present state of health, do you have any difficulty performing the following activities?:  Driving? No  Managing money? No  Feeding yourself? No  Getting from bed to chair? No  Climbing a flight of stairs? No  Preparing food and eating?: No  Bathing or showering? No  Getting dressed: No  Getting to the toilet? No  Using the toilet:No  Moving around from place to place: No  In the past year have you fallen or had a near fall?:No  Are you sexually active? No  Do you have more than one partner? No   Hearing Difficulties: No  Do you often ask people to speak up or repeat themselves? No  Do you experience ringing or noises in your ears? No Do you have difficulty understanding soft or whispered voices? No  Do you feel that you have a problem with memory? No Do you often misplace items? No  Do you feel safe at home? Yes  Cognitive Testing  Alert? Yes Normal Appearance?Yes  Oriented to person? Yes Place? Yes  Time? Yes  Recall of three objects? Yes  Can perform simple calculations? Yes  Displays appropriate judgment?Yes  Can read the correct time from a watch face?Yes    Screening Tests / Date Colonoscopy    2013                 Zostavax utd Pneumovax utd Influenza Vaccine due Tetanus/tdap utd  Review of Systems  All other systems reviewed and are negative.      Objective:   Physical Exam  Vitals reviewed. Constitutional: He is oriented to person, place, and time. He appears well-developed and well-nourished. No distress.  HENT:  Head: Normocephalic and atraumatic.  Right Ear: External ear normal.  Left Ear: External ear normal.  Nose: Nose normal.  Mouth/Throat: Oropharynx is clear and moist. No oropharyngeal exudate.  Eyes: Conjunctivae and EOM are normal. Pupils are equal, round, and  reactive to light. Right eye exhibits no discharge. Left eye exhibits no discharge. No scleral icterus.  Neck: Normal range of motion. Neck supple. No JVD present. No tracheal deviation present. No thyromegaly present.  Cardiovascular: Normal rate, regular rhythm, normal heart sounds and intact distal pulses.  Exam reveals no gallop and no friction rub.   No murmur heard. Pulmonary/Chest: Effort normal and breath sounds normal. No stridor. No respiratory distress. He has no wheezes. He has no rales. He exhibits no tenderness.  Abdominal: Soft. Bowel sounds are  normal. He exhibits no distension and no mass. There is no tenderness. There is no rebound and no guarding.  Musculoskeletal: Normal range of motion. He exhibits no edema and no tenderness.  Lymphadenopathy:    He has no cervical adenopathy.  Neurological: He is alert and oriented to person, place, and time. He has normal reflexes. He displays normal reflexes. No cranial nerve deficit. He exhibits normal muscle tone. Coordination normal.  Skin: Skin is warm. No rash noted. No erythema. No pallor.  Psychiatric: He has a normal mood and affect. His behavior is normal. Judgment and thought content normal.          Assessment & Plan:  Routine general medical examination at a health care facility  patient's lab work other than his PSA is within normal limits. His blood pressure is excellent. He received Prevnar 13 today in addition to his flu shot. The remainder of his preventative care is up-to-date. Regular anticipatory guidance was provided. There was a 1 cm scaly red papule on his upper right chest. This is either an inflamed [place] keratosis or possibly a squamous cell carcinoma in situ. I treated this with cryotherapy using liquid nitrogen for a total of 30 seconds. I recommended that if the lesion persists we should pursue an excisional biopsy. Medicare Attestation  I have personally reviewed:  The patient's medical and social history   Their use of alcohol, tobacco or illicit drugs  Their current medications and supplements  The patient's functional ability including ADLs,fall risks, home safety risks, cognitive, and hearing and visual impairment  Diet and physical activities  Evidence for depression or mood disorders  The patient's weight, height, BMI, and visual acuity have been recorded in the chart. I have made referrals, counseling, and provided education to the patient based on review of the above and I have provided the patient with a written personalized care plan for preventive services.

## 2013-12-18 ENCOUNTER — Other Ambulatory Visit: Payer: Self-pay | Admitting: Family Medicine

## 2013-12-18 NOTE — Telephone Encounter (Signed)
Medication refilled per protocol. 

## 2014-01-01 ENCOUNTER — Telehealth: Payer: Self-pay | Admitting: Family Medicine

## 2014-01-01 MED ORDER — ATENOLOL 25 MG PO TABS: ORAL_TABLET | ORAL | Status: DC

## 2014-01-01 NOTE — Telephone Encounter (Signed)
[phone]  [facility] [address]  Pt is needing a 90 day atenolol (TENORMIN) 25 MG tablet

## 2014-01-01 NOTE — Telephone Encounter (Signed)
90 day supply sent to pharm

## 2014-06-16 ENCOUNTER — Other Ambulatory Visit: Payer: Self-pay | Admitting: Family Medicine

## 2014-06-27 ENCOUNTER — Other Ambulatory Visit: Payer: Self-pay | Admitting: Family Medicine

## 2014-07-27 ENCOUNTER — Ambulatory Visit: Payer: BC Managed Care – PPO | Admitting: Podiatry

## 2014-10-11 ENCOUNTER — Encounter: Payer: Self-pay | Admitting: Family Medicine

## 2014-10-11 ENCOUNTER — Ambulatory Visit (INDEPENDENT_AMBULATORY_CARE_PROVIDER_SITE_OTHER): Payer: Medicare Other | Admitting: Family Medicine

## 2014-10-11 VITALS — BP 100/52 | HR 78 | Temp 97.4°F | Resp 18 | Ht 71.0 in | Wt 197.0 lb

## 2014-10-11 DIAGNOSIS — N3000 Acute cystitis without hematuria: Secondary | ICD-10-CM | POA: Diagnosis not present

## 2014-10-11 DIAGNOSIS — N10 Acute tubulo-interstitial nephritis: Secondary | ICD-10-CM

## 2014-10-11 LAB — URINALYSIS, MICROSCOPIC ONLY
Crystals: NONE SEEN [HPF]
Yeast: NONE SEEN [HPF]

## 2014-10-11 LAB — URINALYSIS, ROUTINE W REFLEX MICROSCOPIC
Glucose, UA: NEGATIVE
LEUKOCYTES UA: NEGATIVE
NITRITE: NEGATIVE
Specific Gravity, Urine: 1.02 (ref 1.001–1.035)
pH: 5 (ref 5.0–8.0)

## 2014-10-11 MED ORDER — CEFTRIAXONE SODIUM 1 G IJ SOLR
1.0000 g | Freq: Once | INTRAMUSCULAR | Status: AC
  Administered 2014-10-11: 1 g via INTRAMUSCULAR

## 2014-10-11 MED ORDER — CIPROFLOXACIN HCL 500 MG PO TABS: 500.0000 mg | ORAL_TABLET | Freq: Two times a day (BID) | ORAL | Status: DC

## 2014-10-11 NOTE — Progress Notes (Signed)
Subjective:    Patient ID: Todd Riley, male    DOB: Oct 18, 1928, 81 y.o.   MRN: [id number]  HPI   patient presents today complaining of fever to 102 for last 2 days.  Starting this morning he developed left-sided low back pain. He has CVA tenderness on that side. He also complains of dysuria and decreased urinary output. The patient's blood pressure was low and he has not even taken his blood pressure medication this morning. He denies any cough. He denies any nausea vomiting or diarrhea. He reports severe fatigue and malaise. He is also had a recent tick bite. He denies any rashes. He denies any headache. He denies any neck stiffness. He denies any joint pains.  Past Medical History  Diagnosis Date  . Hypertension   . Hyperlipidemia   . GERD (gastroesophageal reflux disease)   . Prostate cancer    Past Surgical History  Procedure Laterality Date  . Rotator cuff repair    . Shoulder surgery    . Prostate surgery    . Cardiovascular stress test  06/07/2006    ef 56%, no ischemia   Current Outpatient Prescriptions on File Prior to Visit  Medication Sig Dispense Refill  . aspirin 81 MG tablet Take 81 mg by mouth daily.    Marland Kitchen atenolol (TENORMIN) 25 MG tablet TAKE 1 TABLET BY MOUTH DAILY 90 tablet 3  . calcium carbonate (OS-CAL) 600 MG TABS Take 600 mg by mouth 2 (two) times daily with a meal.    . ezetimibe (ZETIA) 10 MG tablet Take 10 mg by mouth daily.    Marland Kitchen leuprolide (LUPRON) 22.5 MG injection Inject 22.5 mg into the muscle every 3 (three) months.    . Multiple Vitamin (MULTIVITAMIN) tablet Take 1 tablet by mouth daily.    . multivitamin-lutein (OCUVITE-LUTEIN) CAPS Take 1 capsule by mouth daily.    Marland Kitchen omeprazole (PRILOSEC) 20 MG capsule Take 10 mg by mouth daily.    . psyllium (REGULOID) 0.52 G capsule Take 0.52 g by mouth 3 (three) times a week.    . valsartan-hydrochlorothiazide (DIOVAN-HCT) 160-25 MG per tablet TAKE 1 TABLET BY MOUTH DAILY 90 tablet 1   No current  facility-administered medications on file prior to visit.   Allergies  Allergen Reactions  . Sulfa Antibiotics    Social History   Social History  . Marital Status: Single    Spouse Name: N/A  . Number of Children: N/A  . Years of Education: N/A   Occupational History  . Not on file.   Social History Main Topics  . Smoking status: Former Smoker    Quit date: 06/16/1994  . Smokeless tobacco: Not on file  . Alcohol Use: No  . Drug Use: No  . Sexual Activity: Not on file   Other Topics Concern  . Not on file   Social History Narrative     Review of Systems  All other systems reviewed and are negative.      Objective:   Physical Exam  Neck: Neck supple.  Cardiovascular: Normal rate, regular rhythm and normal heart sounds.   No murmur heard. Pulmonary/Chest: Effort normal and breath sounds normal. No respiratory distress. He has no wheezes. He has no rales. He exhibits no tenderness.  Abdominal: Soft. Bowel sounds are normal. He exhibits no distension and no mass. There is no tenderness. There is no rebound and no guarding.  Musculoskeletal: He exhibits no edema.  Lymphadenopathy:    He has no cervical adenopathy.  Skin: No rash noted.  Vitals reviewed.  patient has left-sided CVA tenderness        Assessment & Plan:  Acute cystitis without hematuria - Plan: Urinalysis, Routine w reflex microscopic (not at [facility]), cefTRIAXone (ROCEPHIN) injection 1 g  Acute pyelonephritis - Plan: ciprofloxacin (CIPRO) 500 MG tablet, cefTRIAXone (ROCEPHIN) injection 1 g   I believe the patient is developing left-sided pyelonephritis. Begin 1 gram of Rocephin IM 1 now. This start the patient on Cipro 500 mg by mouth twice a day for 10 days. I will send a urine culture as his urinalysis is nonspecific. If symptoms are not improving at all by the following morning, I would also consider possible [facility] spotted fever as the patient has had recently a tick bite and I would  extend his and abx and cover with doxycycline.

## 2014-10-15 ENCOUNTER — Ambulatory Visit (INDEPENDENT_AMBULATORY_CARE_PROVIDER_SITE_OTHER): Payer: Medicare Other | Admitting: Family Medicine

## 2014-10-15 ENCOUNTER — Encounter: Payer: Self-pay | Admitting: Family Medicine

## 2014-10-15 ENCOUNTER — Ambulatory Visit
Admission: RE | Admit: 2014-10-15 | Discharge: 2014-10-15 | Disposition: A | Discharge status: Home / Self Care | Payer: Medicare Other | Source: Ambulatory Visit | Attending: Family Medicine | Admitting: Family Medicine

## 2014-10-15 VITALS — BP 90/46 | HR 80 | Temp 98.4°F | Resp 16 | Ht 71.0 in | Wt 198.0 lb

## 2014-10-15 DIAGNOSIS — I9589 Other hypotension: Secondary | ICD-10-CM

## 2014-10-15 DIAGNOSIS — R509 Fever, unspecified: Secondary | ICD-10-CM

## 2014-10-15 DIAGNOSIS — Z9189 Other specified personal risk factors, not elsewhere classified: Secondary | ICD-10-CM | POA: Diagnosis not present

## 2014-10-15 LAB — COMPLETE METABOLIC PANEL WITH GFR
ALBUMIN: 2.9 g/dL — AB (ref 3.6–5.1)
ALK PHOS: 34 U/L — AB (ref 40–115)
ALT: 68 U/L — AB (ref 9–46)
AST: 50 U/L — AB (ref 10–35)
BILIRUBIN TOTAL: 0.5 mg/dL (ref 0.2–1.2)
BUN: 18 mg/dL (ref 7–25)
CO2: 22 mmol/L (ref 20–31)
CREATININE: 1.17 mg/dL — AB (ref 0.70–1.11)
Calcium: 7.6 mg/dL — ABNORMAL LOW (ref 8.6–10.3)
Chloride: 99 mmol/L (ref 98–110)
GFR, Est African American: 66 mL/min (ref >=60)
GFR, Est Non African American: 57 mL/min — ABNORMAL LOW (ref >=60)
GLUCOSE: 93 mg/dL (ref 70–99)
Potassium: 4.1 mmol/L (ref 3.5–5.3)
SODIUM: 132 mmol/L — AB (ref 135–146)
TOTAL PROTEIN: 5.6 g/dL — AB (ref 6.1–8.1)

## 2014-10-15 MED ORDER — DOXYCYCLINE HYCLATE 100 MG PO TABS: 100.0000 mg | ORAL_TABLET | Freq: Two times a day (BID) | ORAL | Status: DC

## 2014-10-15 NOTE — Progress Notes (Signed)
Subjective:    Patient ID: Todd Riley, male    DOB: [birth year], 81 y.o.   MRN: [id number]  HPI  10/11/14  patient presents today complaining of fever to 102 for last 2 days.  Starting this morning he developed left-sided low back pain. He has CVA tenderness on that side. He also complains of dysuria and decreased urinary output. The patient's blood pressure was low and he has not even taken his blood pressure medication this morning. He denies any cough. He denies any nausea vomiting or diarrhea. He reports severe fatigue and malaise. He is also had a recent tick bite. He denies any rashes. He denies any headache. He denies any neck stiffness. He denies any joint pains.  At that time, my plan was:  I believe the patient is developing left-sided pyelonephritis. Begin 1 gram of Rocephin IM 1 now. This start the patient on Cipro 500 mg by mouth twice a day for 10 days. I will send a urine culture as his urinalysis is nonspecific. If symptoms are not improving at all by the following morning, I would also consider possible [facility] spotted fever as the patient has had recently a tick bite and I would extend his and abx and cover with doxycycline.  10/15/14 Patient states he felt slightly better after the Rocephin but his symptoms quickly worsened over the weekend. He's been running fevers between 101 and 102 all weekend. He feels extremely weak. His blood pressure is very low today but the patient states he started back on his blood pressure medicine yesterday. He denies any dysuria, hematuria, or left-sided CVA tenderness today. Problems going to the bathroom. His urinary symptoms have completely resolved. However his biggest complaint now is diffuse joint pains primarily in his knees, ankles, hands, and wrist. There is no rash. The patient has flulike symptoms with diffuse myalgias. He denies any headaches or neck stiffness. He denies any recent travel.  Past Medical History  Diagnosis Date    . Hypertension   . Hyperlipidemia   . GERD (gastroesophageal reflux disease)   . Prostate cancer    Past Surgical History  Procedure Laterality Date  . Rotator cuff repair    . Shoulder surgery    . Prostate surgery    . Cardiovascular stress test  06/07/2006    ef 56%, no ischemia   Current Outpatient Prescriptions on File Prior to Visit  Medication Sig Dispense Refill  . aspirin 81 MG tablet Take 81 mg by mouth daily.    Marland Kitchen atenolol (TENORMIN) 25 MG tablet TAKE 1 TABLET BY MOUTH DAILY 90 tablet 3  . calcium carbonate (OS-CAL) 600 MG TABS Take 600 mg by mouth 2 (two) times daily with a meal.    . ciprofloxacin (CIPRO) 500 MG tablet Take 1 tablet (500 mg total) by mouth 2 (two) times daily. 20 tablet 0  . ezetimibe (ZETIA) 10 MG tablet Take 10 mg by mouth daily.    Marland Kitchen leuprolide (LUPRON) 22.5 MG injection Inject 22.5 mg into the muscle every 3 (three) months.    . Multiple Vitamin (MULTIVITAMIN) tablet Take 1 tablet by mouth daily.    . multivitamin-lutein (OCUVITE-LUTEIN) CAPS Take 1 capsule by mouth daily.    Marland Kitchen omeprazole (PRILOSEC) 20 MG capsule Take 10 mg by mouth daily.    . psyllium (REGULOID) 0.52 G capsule Take 0.52 g by mouth 3 (three) times a week.    . valsartan-hydrochlorothiazide (DIOVAN-HCT) 160-25 MG per tablet TAKE 1 TABLET BY MOUTH  DAILY 90 tablet 1   No current facility-administered medications on file prior to visit.   Allergies  Allergen Reactions  . Sulfa Antibiotics    Social History   Social History  . Marital Status: Single    Spouse Name: N/A  . Number of Children: N/A  . Years of Education: N/A   Occupational History  . Not on file.   Social History Main Topics  . Smoking status: Former Smoker    Quit date: 06/16/1994  . Smokeless tobacco: Not on file  . Alcohol Use: No  . Drug Use: No  . Sexual Activity: Not on file   Other Topics Concern  . Not on file   Social History Narrative     Review of Systems  Constitutional: Positive for  fever, chills and fatigue.  HENT: Negative.  Negative for congestion, ear discharge, ear pain, facial swelling, postnasal drip, rhinorrhea and sinus pressure.   Eyes: Negative.   Respiratory: Negative for cough, chest tightness, shortness of breath, wheezing and stridor.   Cardiovascular: Negative.   Gastrointestinal: Negative for abdominal distention.  Genitourinary: Negative for dysuria, urgency, hematuria, flank pain, discharge, difficulty urinating, penile pain and testicular pain.  Musculoskeletal: Positive for myalgias and arthralgias. Negative for back pain, neck pain and neck stiffness.  Neurological: Positive for weakness. Negative for dizziness, seizures, syncope, speech difficulty and numbness.  Hematological: Negative for adenopathy. Does not bruise/bleed easily.  All other systems reviewed and are negative.      Objective:   Physical Exam  Neck: Neck supple.  Cardiovascular: Normal rate, regular rhythm and normal heart sounds.   No murmur heard. Pulmonary/Chest: Effort normal and breath sounds normal. No respiratory distress. He has no wheezes. He has no rales. He exhibits no tenderness.  Abdominal: Soft. Bowel sounds are normal. He exhibits no distension and no mass. There is no tenderness. There is no rebound and no guarding.  Musculoskeletal: He exhibits no edema.  Lymphadenopathy:    He has no cervical adenopathy.  Skin: No rash noted.  Vitals reviewed.         Assessment & Plan:  At high risk for tick borne illness - Plan: doxycycline (VIBRA-TABS) 100 MG tablet  Fever and chills - Plan: CBC with Differential/Platelet, COMPLETE METABOLIC PANEL WITH GFR, [place] spotted fvr abs pnl(IgG+IgM), B. burgdorfi antibodies by WB, Culture, blood (single), Culture, blood (single)  Other specified hypotension  patient's wife is not sick. Therefore I do not believe that this is a viral illness. He denies any viral contacts. Furthermore it is not responding to Cipro.  However he has no symptoms of urinary tract infection any further. Therefore I believe this is a noncontagious bacterial infection. Given the time of year and the patient's frequent outside work, I'm concerned he may have acquired a tick borne illness such as [facility] spotted fever or Lyme disease. Begin doxycycline 100 mg by mouth twice a day immediately. Check a CBC, CMP, Lyme titers, [place] spotted fever titers, blood cultures 2. I will also give the patient IV fluid, 1 L of normal saline. If patient's symptoms worsen I want him to go directly to the hospital. Otherwise I'll recheck the patient in 48 hours.

## 2014-10-16 LAB — CBC WITH DIFFERENTIAL/PLATELET
BASOS PCT: 1 % (ref 0–1)
Basophils Absolute: 0.1 10*3/uL (ref 0.0–0.1)
Eosinophils Absolute: 0.5 10*3/uL (ref 0.0–0.7)
Eosinophils Relative: 5 % (ref 0–5)
HCT: 31.6 % — ABNORMAL LOW (ref 39.0–52.0)
HEMOGLOBIN: 10.8 g/dL — AB (ref 13.0–17.0)
Lymphocytes Relative: 8 % — ABNORMAL LOW (ref 12–46)
Lymphs Abs: 0.7 10*3/uL (ref 0.7–4.0)
MCH: 30.7 pg (ref 26.0–34.0)
MCHC: 34.2 g/dL (ref 30.0–36.0)
MCV: 89.8 fL (ref 78.0–100.0)
MONOS PCT: 9 % (ref 3–12)
MPV: 10.5 fL (ref 8.6–12.4)
Monocytes Absolute: 0.8 10*3/uL (ref 0.1–1.0)
NEUTROS ABS: 6.9 10*3/uL (ref 1.7–7.7)
NEUTROS PCT: 77 % (ref 43–77)
Platelets: 253 10*3/uL (ref 150–400)
RBC: 3.52 MIL/uL — AB (ref 4.22–5.81)
RDW: 13.4 % (ref 11.5–15.5)
WBC: 9 10*3/uL (ref 4.0–10.5)

## 2014-10-16 LAB — ROCKY MTN SPOTTED FVR ABS PNL(IGG+IGM)
RMSF IgG: 0.36 IV
RMSF IgM: 0.12 IV

## 2014-10-17 LAB — B. BURGDORFI ANTIBODIES BY WB
B burgdorferi IgG Abs (IB): NEGATIVE
B burgdorferi IgM Abs (IB): NEGATIVE

## 2014-10-18 ENCOUNTER — Ambulatory Visit (INDEPENDENT_AMBULATORY_CARE_PROVIDER_SITE_OTHER): Payer: Medicare Other | Admitting: Family Medicine

## 2014-10-18 ENCOUNTER — Encounter: Payer: Self-pay | Admitting: Family Medicine

## 2014-10-18 VITALS — BP 130/68 | HR 82 | Temp 97.8°F | Resp 16 | Ht 71.0 in | Wt 197.0 lb

## 2014-10-18 DIAGNOSIS — R509 Fever, unspecified: Secondary | ICD-10-CM | POA: Diagnosis not present

## 2014-10-18 NOTE — Progress Notes (Signed)
Subjective:    Patient ID: Todd Riley, male    DOB: Apr 24, 1928, 81 y.o.   MRN: [id number]  HPI  10/11/14  patient presents today complaining of fever to 102 for last 2 days.  Starting this morning he developed left-sided low back pain. He has CVA tenderness on that side. He also complains of dysuria and decreased urinary output. The patient's blood pressure was low and he has not even taken his blood pressure medication this morning. He denies any cough. He denies any nausea vomiting or diarrhea. He reports severe fatigue and malaise. He is also had a recent tick bite. He denies any rashes. He denies any headache. He denies any neck stiffness. He denies any joint pains.  At that time, my plan was:  I believe the patient is developing left-sided pyelonephritis. Begin 1 gram of Rocephin IM 1 now. This start the patient on Cipro 500 mg by mouth twice a day for 10 days. I will send a urine culture as his urinalysis is nonspecific. If symptoms are not improving at all by the following morning, I would also consider possible [facility] spotted fever as the patient has had recently a tick bite and I would extend his and abx and cover with doxycycline.  10/15/14 Patient states he felt slightly better after the Rocephin but his symptoms quickly worsened over the weekend. He's been running fevers between 101 and 102 all weekend. He feels extremely weak. His blood pressure is very low today but the patient states he started back on his blood pressure medicine yesterday. He denies any dysuria, hematuria, or left-sided CVA tenderness today. Problems going to the bathroom. His urinary symptoms have completely resolved. However his biggest complaint now is diffuse joint pains primarily in his knees, ankles, hands, and wrist. There is no rash. The patient has flulike symptoms with diffuse myalgias. He denies any headaches or neck stiffness. He denies any recent travel.  At that time, my plan was: patient's wife  is not sick. Therefore I do not believe that this is a viral illness. He denies any viral contacts. Furthermore it is not responding to Cipro. However he has no symptoms of urinary tract infection any further. Therefore I believe this is a noncontagious bacterial infection. Given the time of year and the patient's frequent outside work, I'm concerned he may have acquired a tick borne illness such as [facility] spotted fever or Lyme disease. Begin doxycycline 100 mg by mouth twice a day immediately. Check a CBC, CMP, Lyme titers, [place] spotted fever titers, blood cultures 2. I will also give the patient IV fluid, 1 L of normal saline. If patient's symptoms worsen I want him to go directly to the hospital. Otherwise I'll recheck the patient in 48 hours.  10/18/14 Patient is here today for follow up. rmsf and lyme titers are negative.  Labs however did reveal nonspecific findings such as  hyponatremia, mild elevations in liver fcn tests.  Thankfully the patient is doing much better today. His fever has trended down and has almost completely stopped. Patient's fatigue is better although he is still very tired. His joint pains improved. He still has swelling in his hands and his feet although it is mild today. I believe this is some third spacing due to the infection but it seems to be better. Thankfully his blood pressure resolved so much better today  Office Visit on 10/15/2014  Component Date Value Ref Range Status  . WBC 10/15/2014 9.0  4.0 - 10.5 K/uL Final  . RBC 10/15/2014 3.52* 4.22 - 5.81 MIL/uL Final  . Hemoglobin 10/15/2014 10.8* 13.0 - 17.0 g/dL Final  . HCT 10/15/2014 31.6* 39.0 - 52.0 % Final  . MCV 10/15/2014 89.8  78.0 - 100.0 fL Final  . MCH 10/15/2014 30.7  26.0 - 34.0 pg Final  . MCHC 10/15/2014 34.2  30.0 - 36.0 g/dL Final  . RDW 10/15/2014 13.4  11.5 - 15.5 % Final  . Platelets 10/15/2014 253  150 - 400 K/uL Final  . MPV 10/15/2014 10.5  8.6 - 12.4 fL Final  . Neutrophils  Relative % 10/15/2014 77  43 - 77 % Final  . Neutro Abs 10/15/2014 6.9  1.7 - 7.7 K/uL Final  . Lymphocytes Relative 10/15/2014 8* 12 - 46 % Final  . Lymphs Abs 10/15/2014 0.7  0.7 - 4.0 K/uL Final  . Monocytes Relative 10/15/2014 9  3 - 12 % Final  . Monocytes Absolute 10/15/2014 0.8  0.1 - 1.0 K/uL Final  . Eosinophils Relative 10/15/2014 5  0 - 5 % Final  . Eosinophils Absolute 10/15/2014 0.5  0.0 - 0.7 K/uL Final  . Basophils Relative 10/15/2014 1  0 - 1 % Final  . Basophils Absolute 10/15/2014 0.1  0.0 - 0.1 K/uL Final  . Smear Review 10/15/2014 Criteria for review not met   Final  . Sodium 10/15/2014 132* 135 - 146 mmol/L Final  . Potassium 10/15/2014 4.1  3.5 - 5.3 mmol/L Final  . Chloride 10/15/2014 99  98 - 110 mmol/L Final  . CO2 10/15/2014 22  20 - 31 mmol/L Final  . Glucose, Bld 10/15/2014 93  70 - 99 mg/dL Final  . BUN 10/15/2014 18  7 - 25 mg/dL Final  . Creat 10/15/2014 1.17* 0.70 - 1.11 mg/dL Final  . Total Bilirubin 10/15/2014 0.5  0.2 - 1.2 mg/dL Final  . Alkaline Phosphatase 10/15/2014 34* 40 - 115 U/L Final  . AST 10/15/2014 50* 10 - 35 U/L Final  . ALT 10/15/2014 68* 9 - 46 U/L Final  . Total Protein 10/15/2014 5.6* 6.1 - 8.1 g/dL Final  . Albumin 10/15/2014 2.9* 3.6 - 5.1 g/dL Final  . Calcium 10/15/2014 7.6* 8.6 - 10.3 mg/dL Final  . GFR, Est African American 10/15/2014 66  >=60 mL/min Final  . GFR, Est Non African American 10/15/2014 57* >=60 mL/min Final   Comment:   The estimated GFR is a calculation valid for adults (>=47 years old) that uses the CKD-EPI algorithm to adjust for age and sex. It is   not to be used for children, pregnant women, hospitalized patients,    patients on dialysis, or with rapidly changing kidney function. According to the NKDEP, eGFR >89 is normal, 60-89 shows mild impairment, 30-59 shows moderate impairment, 15-29 shows severe impairment and <15 is ESRD.     Marland Kitchen RMSF IgG 10/15/2014 0.36   Final  . RMSF IgM 10/15/2014 0.12    Final   Comment:   IV = Index Value   REFERENCE INTERVAL [facility] Spotted Fever Antibody, IgG, IgM  IgG (IV)    IgM (IV)   Result           Interpretation: ---------   ---------  ---------         --------------- <0.80       <0.90      Negative    No significant level of Rickettsia  ricketsii IgG and/or IgM antibody                                    detected. If clinically indicated,                                    repeat sample within 7-14 days. 0.80-1.20   0.90-1.10  Equivocal   Questionable presence of Rickettsia                                    rickettsii IgG and/or IgM antibody                                    detected. Recommend recollecting                                    and retesting, if clinically                                    indicated. >1.20       >1.10      Positive    Presence of IgG and/or IgM antibody                                    to Rickettsia rickettsii detected.                                    IgG indic                          ates evidence of prior                                    infection and may not indicate                                    active disease.  IgM suggestive                                    of current or recent infection. ThE RMSF IgM test was validated and its performance characteristics determined by [employer].  It has not been cleared or approved by the U.S. Food and Drug Administration.  The FDA has determined that such clearance or approval is not necessary.  This test is used for clinical purposes.  It should not be regarded as investigational or for research.   Footnotes:  (1) ** Please note change in unit of measure and reference range(s). **     . B burgdorferi IgG Abs (IB) 10/15/2014 Negative   Final   Comment:    ** Reference Range  for each analyte: Negative or Non Reactive **    IgG Positive: Any five of the following ten bands: 18, 23, 28, 30,   39, 41, 45, 58, 66, 93 kDa.  IgG Negative: Any pattern that does not meet the IgG positive  criteria.   . B burgdorferi IgM Abs (IB) 10/15/2014 Negative   Final   Comment:    ** Reference Range for each analyte: Negative or Non Reactive **    IgM Positive: Any two of the following three bands: 23, 39, 41 kDa.  IgM Negative: Any pattern that does not meet the IgM positive  criteria. A positive IgM test result alone is not recommended for use in determining active disease due to a high likelihood of a false-positive result in persons with illness greater than 1 month duration. The CDC recommends testing by the EIA antibody test, followed by the Immunoblot confirmatory test only when the EIA test is positive.   . Preliminary Report 10/15/2014 Blood Culture received; No Growth to date;   Preliminary  . Preliminary Report 10/15/2014 Culture will be held for 5 days before issuing   Preliminary  . Preliminary Report 10/15/2014 a Final Negative report.   Preliminary      Past Medical History  Diagnosis Date  . Hypertension   . Hyperlipidemia   . GERD (gastroesophageal reflux disease)   . Prostate cancer    Past Surgical History  Procedure Laterality Date  . Rotator cuff repair    . Shoulder surgery    . Prostate surgery    . Cardiovascular stress test  06/07/2006    ef 56%, no ischemia   Current Outpatient Prescriptions on File Prior to Visit  Medication Sig Dispense Refill  . aspirin 81 MG tablet Take 81 mg by mouth daily.    Marland Kitchen atenolol (TENORMIN) 25 MG tablet TAKE 1 TABLET BY MOUTH DAILY 90 tablet 3  . calcium carbonate (OS-CAL) 600 MG TABS Take 600 mg by mouth 2 (two) times daily with a meal.    . ciprofloxacin (CIPRO) 500 MG tablet Take 1 tablet (500 mg total) by mouth 2 (two) times daily. 20 tablet 0  . doxycycline (VIBRA-TABS) 100 MG tablet Take 1 tablet (100 mg total) by mouth 2 (two) times daily. 20 tablet 0  . ezetimibe (ZETIA) 10 MG tablet Take 10 mg by mouth daily.      Marland Kitchen leuprolide (LUPRON) 22.5 MG injection Inject 22.5 mg into the muscle every 3 (three) months.    . Multiple Vitamin (MULTIVITAMIN) tablet Take 1 tablet by mouth daily.    . multivitamin-lutein (OCUVITE-LUTEIN) CAPS Take 1 capsule by mouth daily.    Marland Kitchen omeprazole (PRILOSEC) 20 MG capsule Take 10 mg by mouth daily.    . psyllium (REGULOID) 0.52 G capsule Take 0.52 g by mouth 3 (three) times a week.    . valsartan-hydrochlorothiazide (DIOVAN-HCT) 160-25 MG per tablet TAKE 1 TABLET BY MOUTH DAILY 90 tablet 1   No current facility-administered medications on file prior to visit.   Allergies  Allergen Reactions  . Sulfa Antibiotics    Social History   Social History  . Marital Status: Single    Spouse Name: N/A  . Number of Children: N/A  . Years of Education: N/A   Occupational History  . Not on file.   Social History Main Topics  . Smoking status: Former Smoker    Quit date: 06/16/1994  . Smokeless tobacco: Not on file  . Alcohol Use: No  . Drug  Use: No  . Sexual Activity: Not on file   Other Topics Concern  . Not on file   Social History Narrative     Review of Systems  Constitutional: Positive for fever, chills and fatigue.  HENT: Negative.  Negative for congestion, ear discharge, ear pain, facial swelling, postnasal drip, rhinorrhea and sinus pressure.   Eyes: Negative.   Respiratory: Negative for cough, chest tightness, shortness of breath, wheezing and stridor.   Cardiovascular: Negative.   Gastrointestinal: Negative for abdominal distention.  Genitourinary: Negative for dysuria, urgency, hematuria, flank pain, discharge, difficulty urinating, penile pain and testicular pain.  Musculoskeletal: Positive for myalgias and arthralgias. Negative for back pain, neck pain and neck stiffness.  Neurological: Positive for weakness. Negative for dizziness, seizures, syncope, speech difficulty and numbness.  Hematological: Negative for adenopathy. Does not bruise/bleed easily.   All other systems reviewed and are negative.      Objective:   Physical Exam  Neck: Neck supple.  Cardiovascular: Normal rate, regular rhythm and normal heart sounds.   No murmur heard. Pulmonary/Chest: Effort normal and breath sounds normal. No respiratory distress. He has no wheezes. He has no rales. He exhibits no tenderness.  Abdominal: Soft. Bowel sounds are normal. He exhibits no distension and no mass. There is no tenderness. There is no rebound and no guarding.  Musculoskeletal: He exhibits no edema.  Lymphadenopathy:    He has no cervical adenopathy.  Skin: No rash noted.  Vitals reviewed.         Assessment & Plan:  Fever and chills  Patient symptoms have slowly started to improve and resolve. I believe the patient is either dealing with a viral syndrome or early tick borne illness such as [facility] spotted fever. In any respect, the patient is slowly improving. Therefore I want him to continue doxycycline. Stay away from his blood pressure medication until after Saturday. If the patient continues to slowly improve and his fever remains absent no follow-up is necessary. If patient's symptoms return or change I want to see him back immediately.

## 2014-10-21 LAB — CULTURE, BLOOD (SINGLE): Organism ID, Bacteria: NO GROWTH

## 2014-11-15 ENCOUNTER — Ambulatory Visit (INDEPENDENT_AMBULATORY_CARE_PROVIDER_SITE_OTHER): Payer: Medicare Other | Admitting: *Deleted

## 2014-11-15 DIAGNOSIS — Z23 Encounter for immunization: Secondary | ICD-10-CM

## 2014-11-15 NOTE — Progress Notes (Signed)
Patient ID: Todd Riley, male   DOB: [birth year], 81 y.o.   MRN: [id number]  Patient seen in office for Influenza Vaccination.   Tolerated IM administration well.   Immunization history updated.

## 2014-12-12 ENCOUNTER — Other Ambulatory Visit: Payer: Medicare Other

## 2014-12-12 DIAGNOSIS — I10 Essential (primary) hypertension: Secondary | ICD-10-CM

## 2014-12-12 DIAGNOSIS — Z0000 Encounter for general adult medical examination without abnormal findings: Secondary | ICD-10-CM

## 2014-12-12 LAB — LIPID PANEL
CHOL/HDL RATIO: 4.7 ratio (ref <=5.0)
CHOLESTEROL: 199 mg/dL (ref 125–200)
HDL: 42 mg/dL (ref >=40)
LDL Cholesterol: 123 mg/dL (ref <130)
Triglycerides: 168 mg/dL — ABNORMAL HIGH (ref <150)
VLDL: 34 mg/dL — ABNORMAL HIGH (ref <30)

## 2014-12-12 LAB — COMPLETE METABOLIC PANEL WITH GFR
ALBUMIN: 3.9 g/dL (ref 3.6–5.1)
ALK PHOS: 42 U/L (ref 40–115)
ALT: 16 U/L (ref 9–46)
AST: 27 U/L (ref 10–35)
BILIRUBIN TOTAL: 0.7 mg/dL (ref 0.2–1.2)
BUN: 15 mg/dL (ref 7–25)
CO2: 28 mmol/L (ref 20–31)
CREATININE: 0.97 mg/dL (ref 0.70–1.11)
Calcium: 9.7 mg/dL (ref 8.6–10.3)
Chloride: 101 mmol/L (ref 98–110)
GFR, Est African American: 83 mL/min (ref >=60)
GFR, Est Non African American: 71 mL/min (ref >=60)
GLUCOSE: 92 mg/dL (ref 70–99)
Potassium: 4.3 mmol/L (ref 3.5–5.3)
SODIUM: 139 mmol/L (ref 135–146)
TOTAL PROTEIN: 7.2 g/dL (ref 6.1–8.1)

## 2014-12-12 LAB — CBC WITH DIFFERENTIAL/PLATELET
BASOS PCT: 1 % (ref 0–1)
Basophils Absolute: 0.1 10*3/uL (ref 0.0–0.1)
EOS PCT: 6 % — AB (ref 0–5)
Eosinophils Absolute: 0.4 10*3/uL (ref 0.0–0.7)
HEMATOCRIT: 37.2 % — AB (ref 39.0–52.0)
HEMOGLOBIN: 12.2 g/dL — AB (ref 13.0–17.0)
LYMPHS PCT: 22 % (ref 12–46)
Lymphs Abs: 1.5 10*3/uL (ref 0.7–4.0)
MCH: 29.3 pg (ref 26.0–34.0)
MCHC: 32.8 g/dL (ref 30.0–36.0)
MCV: 89.4 fL (ref 78.0–100.0)
MONO ABS: 0.7 10*3/uL (ref 0.1–1.0)
MONOS PCT: 10 % (ref 3–12)
MPV: 10.4 fL (ref 8.6–12.4)
NEUTROS ABS: 4.1 10*3/uL (ref 1.7–7.7)
Neutrophils Relative %: 61 % (ref 43–77)
PLATELETS: 222 10*3/uL (ref 150–400)
RBC: 4.16 MIL/uL — ABNORMAL LOW (ref 4.22–5.81)
RDW: 14.7 % (ref 11.5–15.5)
WBC: 6.8 10*3/uL (ref 4.0–10.5)

## 2014-12-13 LAB — PSA, MEDICARE: PSA: 0.68 ng/mL (ref <=4.00)

## 2014-12-20 ENCOUNTER — Other Ambulatory Visit: Payer: Self-pay | Admitting: Family Medicine

## 2014-12-20 NOTE — Telephone Encounter (Signed)
Medication refilled per protocol. 

## 2014-12-24 ENCOUNTER — Encounter: Payer: Self-pay | Admitting: Family Medicine

## 2014-12-24 ENCOUNTER — Ambulatory Visit (INDEPENDENT_AMBULATORY_CARE_PROVIDER_SITE_OTHER): Payer: Medicare Other | Admitting: Family Medicine

## 2014-12-24 VITALS — BP 118/68 | HR 64 | Temp 97.8°F | Resp 18 | Wt 188.0 lb

## 2014-12-24 DIAGNOSIS — M545 Low back pain, unspecified: Secondary | ICD-10-CM

## 2014-12-24 DIAGNOSIS — Z23 Encounter for immunization: Secondary | ICD-10-CM | POA: Diagnosis not present

## 2014-12-24 DIAGNOSIS — Z0000 Encounter for general adult medical examination without abnormal findings: Secondary | ICD-10-CM

## 2014-12-24 NOTE — Progress Notes (Signed)
Subjective:    Patient ID: Todd Riley, male    DOB: 06/02/1928, 81 y.o.   MRN: [id number]  HPI  Subjective:   Patient presents for Medicare Annual/Subsequent preventive examination.  Patient's PSA had recently risen to 11.47 last year. He has a history of prostate cancer. He resumed lupron injections and PSA fell to 0.68!  He is due for Prevnar 13. He has had his flu shot. The remainder of his cancer screening is up-to-date. He does complain of some numbness in both feet on the plantar aspects of the soles of his feet. He also complains of persistent daily low back pain in the right side /right paraspinal muscles of the lumbar spine.His most recent lab work as listed below: Lab on 12/12/2014  Component Date Value Ref Range Status  . WBC 12/12/2014 6.8  4.0 - 10.5 K/uL Final  . RBC 12/12/2014 4.16* 4.22 - 5.81 MIL/uL Final  . Hemoglobin 12/12/2014 12.2* 13.0 - 17.0 g/dL Final  . HCT 12/12/2014 37.2* 39.0 - 52.0 % Final  . MCV 12/12/2014 89.4  78.0 - 100.0 fL Final  . MCH 12/12/2014 29.3  26.0 - 34.0 pg Final  . MCHC 12/12/2014 32.8  30.0 - 36.0 g/dL Final  . RDW 12/12/2014 14.7  11.5 - 15.5 % Final  . Platelets 12/12/2014 222  150 - 400 K/uL Final  . MPV 12/12/2014 10.4  8.6 - 12.4 fL Final  . Neutrophils Relative % 12/12/2014 61  43 - 77 % Final  . Neutro Abs 12/12/2014 4.1  1.7 - 7.7 K/uL Final  . Lymphocytes Relative 12/12/2014 22  12 - 46 % Final  . Lymphs Abs 12/12/2014 1.5  0.7 - 4.0 K/uL Final  . Monocytes Relative 12/12/2014 10  3 - 12 % Final  . Monocytes Absolute 12/12/2014 0.7  0.1 - 1.0 K/uL Final  . Eosinophils Relative 12/12/2014 6* 0 - 5 % Final  . Eosinophils Absolute 12/12/2014 0.4  0.0 - 0.7 K/uL Final  . Basophils Relative 12/12/2014 1  0 - 1 % Final  . Basophils Absolute 12/12/2014 0.1  0.0 - 0.1 K/uL Final  . Smear Review 12/12/2014 Criteria for review not met   Final  . Cholesterol 12/12/2014 199  125 - 200 mg/dL Final  . Triglycerides 12/12/2014 168* <150  mg/dL Final  . HDL 12/12/2014 42  >=40 mg/dL Final  . Total CHOL/HDL Ratio 12/12/2014 4.7  <=5.0 Ratio Final  . VLDL 12/12/2014 34* <30 mg/dL Final  . LDL Cholesterol 12/12/2014 123  <130 mg/dL Final   Comment:   Total Cholesterol/HDL Ratio:CHD Risk                        Coronary Heart Disease Risk Table                                        Men       Women          1/2 Average Risk              3.4        3.3              Average Risk              5.0        4.4  2X Average Risk              9.6        7.1           3X Average Risk             23.4       11.0 Use the calculated Patient Ratio above and the CHD Risk table  to determine the patient's CHD Risk.   Marland Kitchen PSA 12/12/2014 0.68  <=4.00 ng/mL Final   Comment: Result repeated and verified. Test Methodology: ECLIA PSA (Electrochemiluminescence Immunoassay)   For PSA values from 2.5-4.0, particularly in younger men <1 years old, the AUA and NCCN suggest testing for % Free PSA (3515) and evaluation of the rate of increase in PSA (PSA velocity).   . Sodium 12/12/2014 139  135 - 146 mmol/L Final  . Potassium 12/12/2014 4.3  3.5 - 5.3 mmol/L Final  . Chloride 12/12/2014 101  98 - 110 mmol/L Final  . CO2 12/12/2014 28  20 - 31 mmol/L Final  . Glucose, Bld 12/12/2014 92  70 - 99 mg/dL Final  . BUN 12/12/2014 15  7 - 25 mg/dL Final  . Creat 12/12/2014 0.97  0.70 - 1.11 mg/dL Final  . Total Bilirubin 12/12/2014 0.7  0.2 - 1.2 mg/dL Final  . Alkaline Phosphatase 12/12/2014 42  40 - 115 U/L Final  . AST 12/12/2014 27  10 - 35 U/L Final  . ALT 12/12/2014 16  9 - 46 U/L Final  . Total Protein 12/12/2014 7.2  6.1 - 8.1 g/dL Final  . Albumin 12/12/2014 3.9  3.6 - 5.1 g/dL Final  . Calcium 12/12/2014 9.7  8.6 - 10.3 mg/dL Final  . GFR, Est African American 12/12/2014 83  >=60 mL/min Final  . GFR, Est Non African American 12/12/2014 71  >=60 mL/min Final   Comment:   The estimated GFR is a calculation valid for adults (>=18 years  old) that uses the CKD-EPI algorithm to adjust for age and sex. It is   not to be used for children, pregnant women, hospitalized patients,    patients on dialysis, or with rapidly changing kidney function. According to the NKDEP, eGFR >89 is normal, 60-89 shows mild impairment, 30-59 shows moderate impairment, 15-29 shows severe impairment and <15 is ESRD.         Review Past Medical/Family/Social:  Past Medical History  Diagnosis Date  . Hypertension   . Hyperlipidemia   . GERD (gastroesophageal reflux disease)   . Prostate cancer    Past Surgical History  Procedure Laterality Date  . Rotator cuff repair    . Shoulder surgery    . Prostate surgery    . Cardiovascular stress test  06/07/2006    ef 56%, no ischemia   Current Outpatient Prescriptions on File Prior to Visit  Medication Sig Dispense Refill  . aspirin 81 MG tablet Take 81 mg by mouth daily.    Marland Kitchen atenolol (TENORMIN) 25 MG tablet TAKE 1 TABLET BY MOUTH DAILY (Patient not taking: Reported on 10/18/2014) 90 tablet 3  . calcium carbonate (OS-CAL) 600 MG TABS Take 600 mg by mouth 2 (two) times daily with a meal.    . doxycycline (VIBRA-TABS) 100 MG tablet Take 1 tablet (100 mg total) by mouth 2 (two) times daily. 20 tablet 0  . ezetimibe (ZETIA) 10 MG tablet Take 10 mg by mouth daily.    Marland Kitchen leuprolide (LUPRON) 22.5 MG injection Inject 22.5 mg into the  muscle every 3 (three) months.    . Multiple Vitamin (MULTIVITAMIN) tablet Take 1 tablet by mouth daily.    . multivitamin-lutein (OCUVITE-LUTEIN) CAPS Take 1 capsule by mouth daily.    Marland Kitchen omeprazole (PRILOSEC) 20 MG capsule Take 10 mg by mouth daily.    . psyllium (REGULOID) 0.52 G capsule Take 0.52 g by mouth 3 (three) times a week.    . valsartan-hydrochlorothiazide (DIOVAN-HCT) 160-25 MG tablet TAKE 1 TABLET BY MOUTH DAILY 90 tablet 1   No current facility-administered medications on file prior to visit.   Allergies  Allergen Reactions  . Sulfa Antibiotics     Social History   Social History  . Marital Status: Single    Spouse Name: N/A  . Number of Children: N/A  . Years of Education: N/A   Occupational History  . Not on file.   Social History Main Topics  . Smoking status: Former Smoker    Quit date: 06/16/1994  . Smokeless tobacco: Not on file  . Alcohol Use: No  . Drug Use: No  . Sexual Activity: Not on file   Other Topics Concern  . Not on file   Social History Narrative   No family history on file.  Depression Screen  (Note: if answer to either of the following is "Yes", a more complete depression screening is indicated)  Over the past two weeks, have you felt down, depressed or hopeless? No Over the past two weeks, have you felt little interest or pleasure in doing things? No Have you lost interest or pleasure in daily life? No Do you often feel hopeless? No Do you cry easily over simple problems? No   Activities of Daily Living  In your present state of health, do you have any difficulty performing the following activities?:  Driving? No  Managing money? No  Feeding yourself? No  Getting from bed to chair? No  Climbing a flight of stairs? No  Preparing food and eating?: No  Bathing or showering? No  Getting dressed: No  Getting to the toilet? No  Using the toilet:No  Moving around from place to place: No  In the past year have you fallen or had a near fall?:No  Are you sexually active? No  Do you have more than one partner? No   Hearing Difficulties: No  Do you often ask people to speak up or repeat themselves? No  Do you experience ringing or noises in your ears? No Do you have difficulty understanding soft or whispered voices? No  Do you feel that you have a problem with memory? No Do you often misplace items? No  Do you feel safe at home? Yes  Cognitive Testing  Alert? Yes Normal Appearance?Yes  Oriented to person? Yes Place? Yes  Time? Yes  Recall of three objects? Yes  Can perform simple  calculations? Yes  Displays appropriate judgment?Yes  Can read the correct time from a watch face?Yes    Screening Tests / Date Colonoscopy    2013                 Zostavax utd Pneumovax utd Influenza Vaccine utd Prevnar due  Review of Systems  All other systems reviewed and are negative.      Objective:   Physical Exam  Constitutional: He is oriented to person, place, and time. He appears well-developed and well-nourished. No distress.  HENT:  Head: Normocephalic and atraumatic.  Right Ear: External ear normal.  Left Ear: External ear normal.  Nose: Nose normal.  Mouth/Throat: Oropharynx is clear and moist. No oropharyngeal exudate.  Eyes: Conjunctivae and EOM are normal. Pupils are equal, round, and reactive to light. Right eye exhibits no discharge. Left eye exhibits no discharge. No scleral icterus.  Neck: Normal range of motion. Neck supple. No JVD present. No tracheal deviation present. No thyromegaly present.  Cardiovascular: Normal rate, regular rhythm, normal heart sounds and intact distal pulses.  Exam reveals no gallop and no friction rub.   No murmur heard. Pulmonary/Chest: Effort normal and breath sounds normal. No stridor. No respiratory distress. He has no wheezes. He has no rales. He exhibits no tenderness.  Abdominal: Soft. Bowel sounds are normal. He exhibits no distension and no mass. There is no tenderness. There is no rebound and no guarding.  Musculoskeletal: Normal range of motion. He exhibits no edema or tenderness.  Lymphadenopathy:    He has no cervical adenopathy.  Neurological: He is alert and oriented to person, place, and time. He has normal reflexes. No cranial nerve deficit. He exhibits normal muscle tone. Coordination normal.  Skin: Skin is warm. No rash noted. No erythema. No pallor.  Psychiatric: He has a normal mood and affect. His behavior is normal. Judgment and thought content normal.  Vitals reviewed.         Assessment & Plan:    Routine general medical examination at a health care facility  patient's lab work is within normal limits. The remainder of his preventative care is up-to-date. Regular anticipatory guidance was provided.  I recommended Prevnar 13 today. Given his persistent low back pain, I will order an x-ray of the lumbar spine. I suspect degenerative disc disease. Medicare Attestation  I have personally reviewed:  The patient's medical and social history  Their use of alcohol, tobacco or illicit drugs  Their current medications and supplements  The patient's functional ability including ADLs,fall risks, home safety risks, cognitive, and hearing and visual impairment  Diet and physical activities  Evidence for depression or mood disorders  The patient's weight, height, BMI, and visual acuity have been recorded in the chart. I have made referrals, counseling, and provided education to the patient based on review of the above and I have provided the patient with a written personalized care plan for preventive services.

## 2014-12-28 ENCOUNTER — Ambulatory Visit
Admission: RE | Admit: 2014-12-28 | Discharge: 2014-12-28 | Disposition: A | Discharge status: Home / Self Care | Payer: Medicare Other | Source: Ambulatory Visit | Attending: Family Medicine | Admitting: Family Medicine

## 2014-12-28 DIAGNOSIS — M545 Low back pain, unspecified: Secondary | ICD-10-CM

## 2015-04-22 ENCOUNTER — Ambulatory Visit (INDEPENDENT_AMBULATORY_CARE_PROVIDER_SITE_OTHER): Payer: Medicare Other | Admitting: Physician Assistant

## 2015-04-22 ENCOUNTER — Encounter: Payer: Self-pay | Admitting: Physician Assistant

## 2015-04-22 VITALS — BP 114/60 | HR 80 | Temp 97.3°F | Resp 18 | Wt 202.0 lb

## 2015-04-22 DIAGNOSIS — M259 Joint disorder, unspecified: Secondary | ICD-10-CM

## 2015-04-22 DIAGNOSIS — M79672 Pain in left foot: Secondary | ICD-10-CM | POA: Diagnosis not present

## 2015-04-22 DIAGNOSIS — R2991 Unspecified symptoms and signs involving the musculoskeletal system: Secondary | ICD-10-CM

## 2015-04-22 NOTE — Progress Notes (Signed)
Patient ID: Todd Riley MRN: [id number], DOB: 04-Dec-1928, 82 y.o. Date of Encounter: 04/22/2015, 3:46 PM    Chief Complaint:  Chief Complaint  Patient presents with  . painful left ankle    no known injury     HPI: 82 y.o. year old white male here with his wife for OV.   Reports that he started noticing this pain on Saturday 04/20/15. Says that he has no pain when he is at rest-- only feels the pain once he is having weightbearing.   Points to area of foot that is area of pain---- this area is inferior/posterior to medial malleolus. There is no erythema at all. No ecchymosis.   Later in the conversation, after I discussed that he is not having findings that we usually see with sprain, gout, plantar fasciitis----common causes of foot pain----then patient's wife adds that he walks on the inner sides of his feet. She says that he has done this as long as she has known him. Pt says that when he was a child he would trip up and fall --  even on flat ground. Says that he has always done this.  When I commented that I am surprised this has not hurt him prior to now, he does add that it has been hurting some but just not much until this past Saturday.  Also, noted he is unsteady on his feet as he was walking out of office.      Home Meds:   Outpatient Prescriptions Prior to Visit  Medication Sig Dispense Refill  . aspirin 81 MG tablet Take 81 mg by mouth daily.    Marland Kitchen atenolol (TENORMIN) 25 MG tablet TAKE 1 TABLET BY MOUTH DAILY 90 tablet 3  . calcium carbonate (OS-CAL) 600 MG TABS Take 600 mg by mouth 2 (two) times daily with a meal.    . Multiple Vitamin (MULTIVITAMIN) tablet Take 1 tablet by mouth daily.    . multivitamin-lutein (OCUVITE-LUTEIN) CAPS Take 1 capsule by mouth daily.    Marland Kitchen omeprazole (PRILOSEC) 20 MG capsule Take 10 mg by mouth daily.    . psyllium (REGULOID) 0.52 G capsule Take 0.52 g by mouth 3 (three) times a week.    . valsartan-hydrochlorothiazide (DIOVAN-HCT)  160-25 MG tablet TAKE 1 TABLET BY MOUTH DAILY 90 tablet 1  . doxycycline (VIBRA-TABS) 100 MG tablet Take 1 tablet (100 mg total) by mouth 2 (two) times daily. 20 tablet 0   No facility-administered medications prior to visit.    Allergies:  Allergies  Allergen Reactions  . Sulfa Antibiotics       Review of Systems: See HPI for pertinent ROS. All other ROS negative.    Physical Exam: Blood pressure 114/60, pulse 80, temperature 97.3 F (36.3 C), temperature source Oral, resp. rate 18, weight 202 lb (91.627 kg)., Body mass index is 28.19 kg/(m^2). General:  WNWD WM. Appears in no acute distress. Neck: Supple. No thyromegaly. No lymphadenopathy. Lungs: Clear bilaterally to auscultation without wheezes, rales, or rhonchi. Breathing is unlabored. Heart: Regular rhythm. No murmurs, rubs, or gallops. Msk:  Strength and tone normal for age. Left Foot: Area that he points to as area of pain is Inferior, Posterior to Medial Malleolus.  There is no erythema, no ecchymosis.  Palpation of Medial malleolus, Lateral Malleolus, Anterior Joint Line: No tenderness with palpation of any of these areas. Full range of motion is intact at the ankle joint.  When he stands, he is standing on the medial aspect of  his feet, with feet everted. Extremities/Skin: Warm and dry.  No edema.  Neuro: Alert and oriented X 3. Moves all extremities spontaneously. Gait is normal. CNII-XII grossly in tact. Psych:  Responds to questions appropriately with a normal affect.     ASSESSMENT AND PLAN:  82 y.o. year old male with  1. Left foot pain Will refer to Dr./ Rocco Serene Specialist at [facility]. Patient and wife are agreeable with this approach. - Ambulatory referral to Orthopedic Surgery  2. Abnormal foot finding - Ambulatory referral to Orthopedic Surgery   Signed, Olean Ree [place], [place], [facility] 04/22/2015 3:46 PM

## 2015-04-25 ENCOUNTER — Telehealth: Payer: Self-pay | Admitting: *Deleted

## 2015-04-25 NOTE — Telephone Encounter (Signed)
Pt has appt scheduled with Dr. Drema Dallas at [facility] orthopedic on Tues March 14 at 10am, pt is to arrive at 9:30am. [facility] to pt for appt information

## 2015-04-25 NOTE — Telephone Encounter (Signed)
Pt wife called back and aware of appt 

## 2015-06-24 ENCOUNTER — Other Ambulatory Visit: Payer: Self-pay | Admitting: Family Medicine

## 2015-06-24 NOTE — Telephone Encounter (Signed)
Refill appropriate and filled per protocol. 

## 2015-07-01 ENCOUNTER — Other Ambulatory Visit: Payer: Self-pay | Admitting: Family Medicine

## 2015-07-01 NOTE — Telephone Encounter (Signed)
Refill appropriate and filled per protocol. 

## 2015-07-29 ENCOUNTER — Encounter: Payer: Self-pay | Admitting: Family Medicine

## 2015-07-29 ENCOUNTER — Ambulatory Visit (INDEPENDENT_AMBULATORY_CARE_PROVIDER_SITE_OTHER): Payer: Medicare Other | Admitting: Family Medicine

## 2015-07-29 VITALS — BP 130/64 | HR 78 | Temp 97.8°F | Resp 14 | Ht 71.0 in | Wt 199.0 lb

## 2015-07-29 DIAGNOSIS — T148 Other injury of unspecified body region: Secondary | ICD-10-CM | POA: Diagnosis not present

## 2015-07-29 DIAGNOSIS — W57XXXA Bitten or stung by nonvenomous insect and other nonvenomous arthropods, initial encounter: Secondary | ICD-10-CM | POA: Diagnosis not present

## 2015-07-29 MED ORDER — DOXYCYCLINE HYCLATE 100 MG PO TABS: 100.0000 mg | ORAL_TABLET | Freq: Two times a day (BID) | ORAL | Status: DC

## 2015-07-29 NOTE — Progress Notes (Signed)
   Subjective:    Patient ID: Todd Riley, male    DOB: 08-16-1928, 82 y.o.   MRN: [id number]  HPI  He had a tick bite 5 days ago just below his right gluteus. Today there is a 1.5 cm erythematous urticarial papule where the tick bit him. There is no spreading red ring. He denies any fevers or chills. He denies any headache or neck pain or arthralgias. He denies any flulike symptoms. Past Medical History  Diagnosis Date  . Hypertension   . Hyperlipidemia   . GERD (gastroesophageal reflux disease)   . Prostate cancer [facility])    Past Surgical History  Procedure Laterality Date  . Rotator cuff repair    . Shoulder surgery    . Prostate surgery    . Cardiovascular stress test  06/07/2006    ef 56%, no ischemia   Current Outpatient Prescriptions on File Prior to Visit  Medication Sig Dispense Refill  . aspirin 81 MG tablet Take 81 mg by mouth daily.    Marland Kitchen atenolol (TENORMIN) 25 MG tablet TAKE 1 TABLET BY MOUTH DAILY 90 tablet 3  . calcium carbonate (OS-CAL) 600 MG TABS Take 600 mg by mouth 2 (two) times daily with a meal.    . Multiple Vitamin (MULTIVITAMIN) tablet Take 1 tablet by mouth daily.    . multivitamin-lutein (OCUVITE-LUTEIN) CAPS Take 1 capsule by mouth daily.    Marland Kitchen omeprazole (PRILOSEC) 20 MG capsule Take 10 mg by mouth daily.    . valsartan-hydrochlorothiazide (DIOVAN-HCT) 160-25 MG tablet TAKE 1 TABLET BY MOUTH DAILY 90 tablet 1   No current facility-administered medications on file prior to visit.   Allergies  Allergen Reactions  . Sulfa Antibiotics    Social History   Social History  . Marital Status: Single    Spouse Name: N/A  . Number of Children: N/A  . Years of Education: N/A   Occupational History  . Not on file.   Social History Main Topics  . Smoking status: Former Smoker    Quit date: 06/16/1994  . Smokeless tobacco: Not on file  . Alcohol Use: No  . Drug Use: No  . Sexual Activity: Not on file   Other Topics Concern  . Not on file    Social History Narrative     Review of Systems  All other systems reviewed and are negative.      Objective:   Physical Exam  Cardiovascular: Normal rate, regular rhythm and normal heart sounds.   Pulmonary/Chest: Effort normal and breath sounds normal. No respiratory distress. He has no wheezes. He has no rales.  Abdominal: Soft. Bowel sounds are normal.  Lymphadenopathy:    He has no cervical adenopathy.  Skin: There is erythema.  Vitals reviewed.         Assessment & Plan:  Tick bite - Plan: doxycycline (VIBRA-TABS) 100 MG tablet  At the present time, I believe this is just a local allergic/immune mediated reaction to an insect bite. I recommended Benadryl and cortisone cream and tincture of time. I did give him a prescription for doxycycline. Should he develop a spreading red ring consistent with Lyme disease or should he develop flulike symptoms, he is to start taking the antibiotic immediately.

## 2015-10-16 IMAGING — CR DG CHEST 2V
2 series · 2 of 2 positions shown · non-contrast
Comparison: None.

CLINICAL DATA: One week history of intermittent fever. Personal
history of prostate cancer. Former smoker.

EXAM:
CHEST  2 VIEW

[w chest pa]
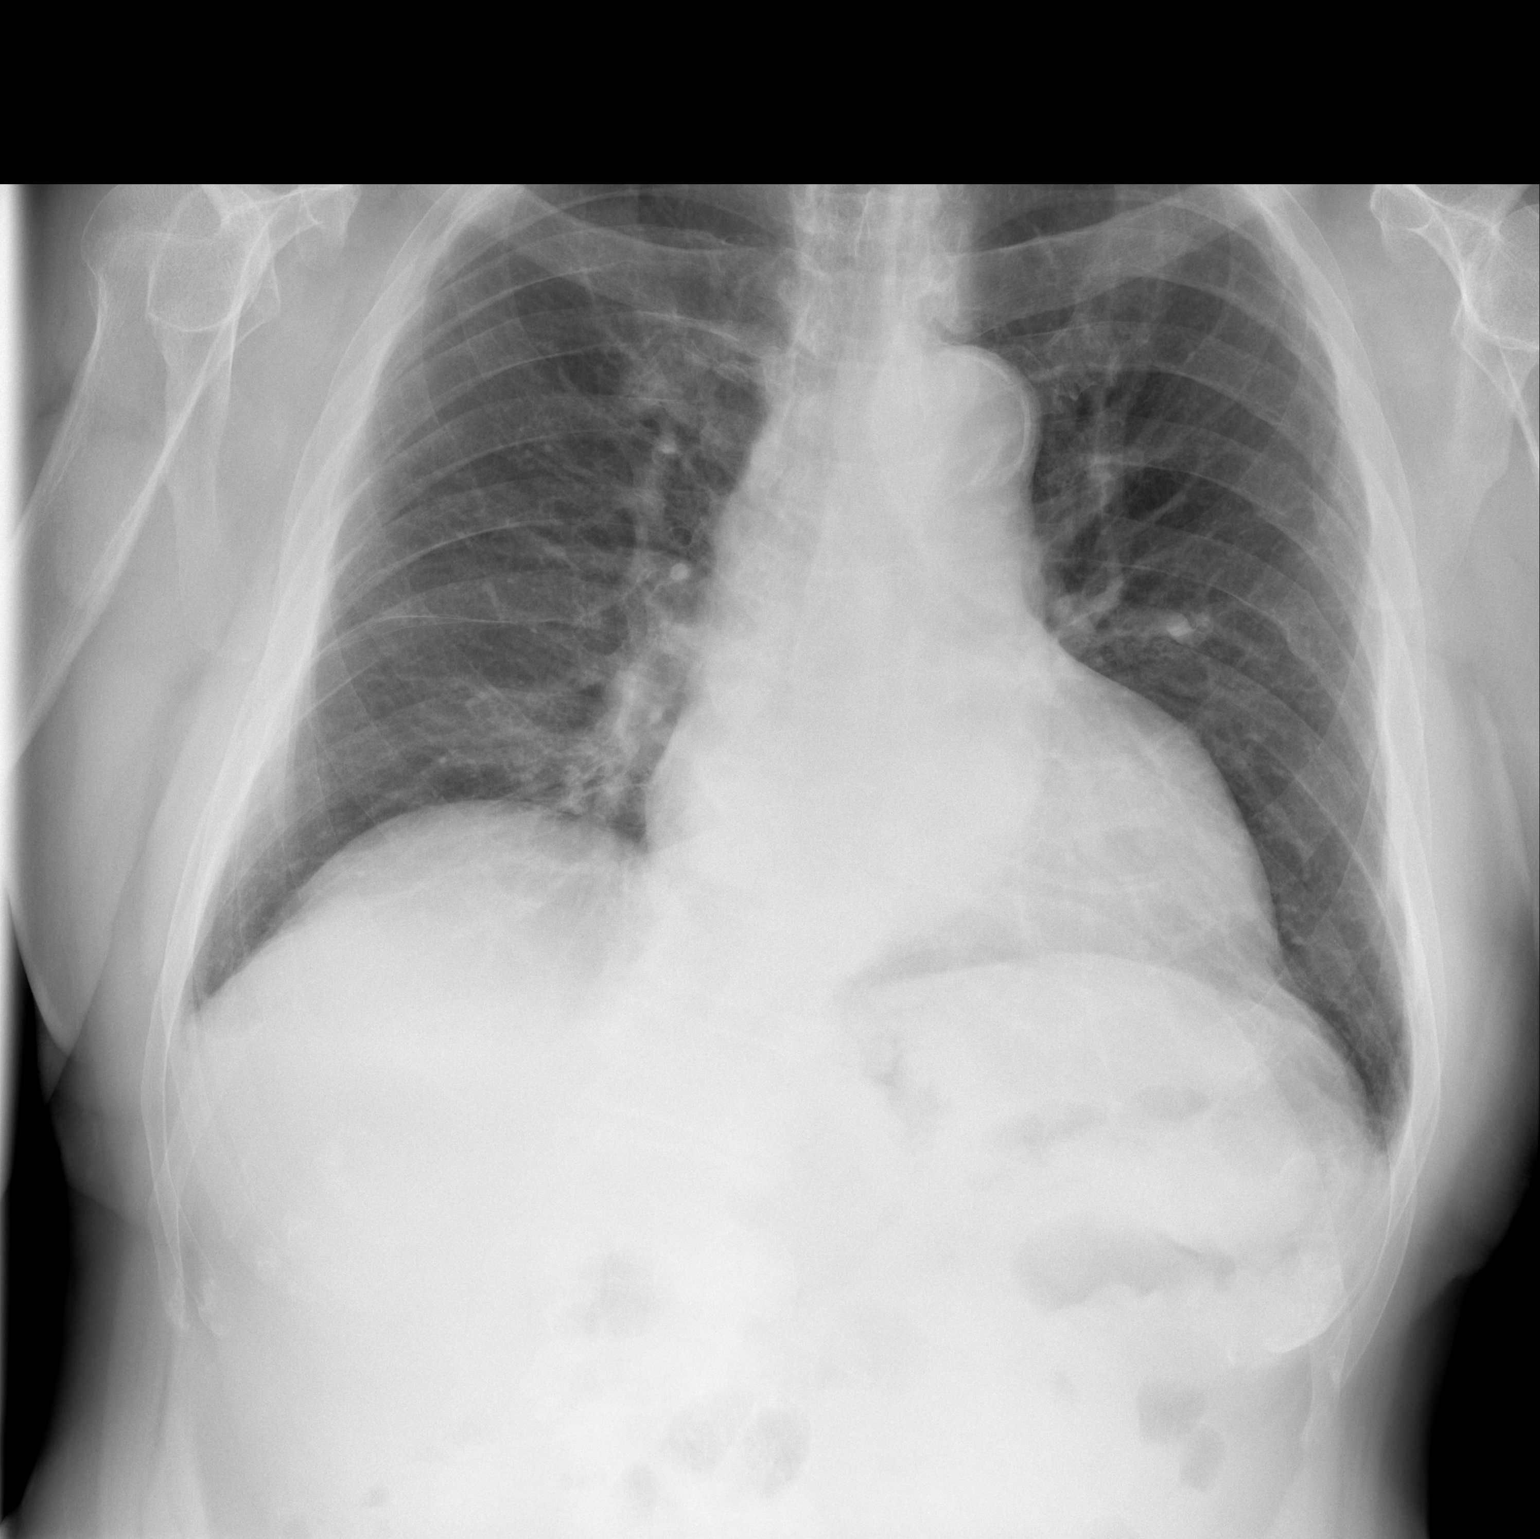

[w chest lat]
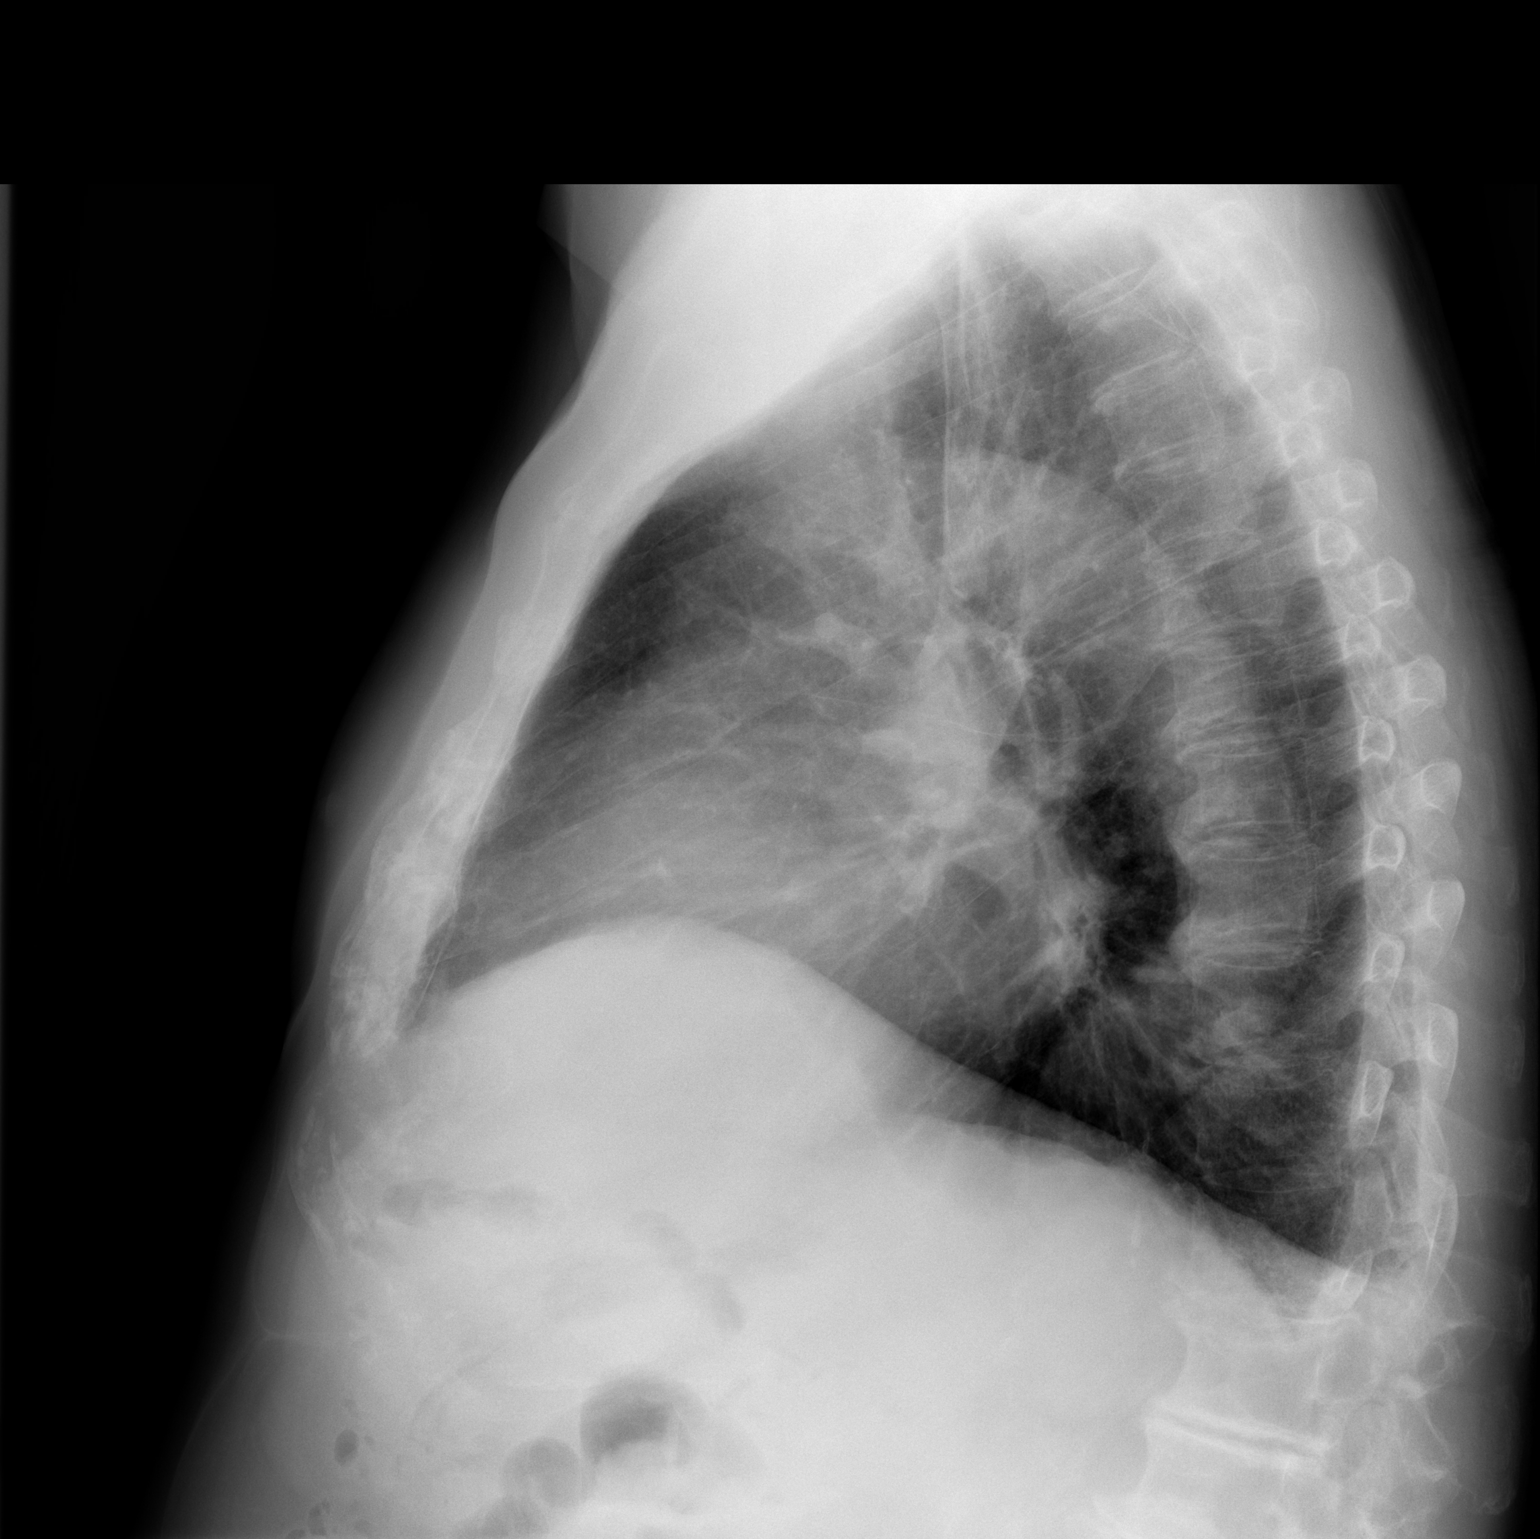

[2 of 2 positions shown; findings below may reference images not displayed]

FINDINGS: Cardiac silhouette mildly to moderately enlarged. Thoracic aorta
tortuous and atherosclerotic. Hilar and mediastinal contours
otherwise unremarkable. Eventration right anterior hemidiaphragm.
Lungs clear. Bronchovascular markings normal. Pulmonary vascularity
normal. No visible pleural effusions. No pneumothorax. Degenerative
changes involving the thoracic and upper lumbar spine.
IMPRESSION: Cardiomegaly.  No acute cardiopulmonary disease.

## 2015-11-12 ENCOUNTER — Ambulatory Visit (INDEPENDENT_AMBULATORY_CARE_PROVIDER_SITE_OTHER): Payer: Medicare Other

## 2015-11-12 DIAGNOSIS — Z23 Encounter for immunization: Secondary | ICD-10-CM | POA: Diagnosis not present

## 2015-11-19 ENCOUNTER — Ambulatory Visit: Payer: Medicare Other | Admitting: Family Medicine

## 2015-12-30 ENCOUNTER — Other Ambulatory Visit: Payer: Medicare Other

## 2015-12-30 DIAGNOSIS — Z125 Encounter for screening for malignant neoplasm of prostate: Secondary | ICD-10-CM

## 2015-12-30 DIAGNOSIS — I10 Essential (primary) hypertension: Secondary | ICD-10-CM

## 2015-12-30 DIAGNOSIS — Z0000 Encounter for general adult medical examination without abnormal findings: Secondary | ICD-10-CM

## 2015-12-30 LAB — CBC WITH DIFFERENTIAL/PLATELET
BASOS ABS: 72 {cells}/uL (ref 0–200)
Basophils Relative: 1 %
EOS ABS: 504 {cells}/uL — AB (ref 15–500)
EOS PCT: 7 %
HCT: 40.1 % (ref 38.5–50.0)
Hemoglobin: 13 g/dL (ref 13.0–17.0)
LYMPHS PCT: 18 %
Lymphs Abs: 1296 cells/uL (ref 850–3900)
MCH: 30.1 pg (ref 27.0–33.0)
MCHC: 32.4 g/dL (ref 32.0–36.0)
MCV: 92.8 fL (ref 80.0–100.0)
MONOS PCT: 8 %
MPV: 11 fL (ref 7.5–12.5)
Monocytes Absolute: 576 cells/uL (ref 200–950)
Neutro Abs: 4752 cells/uL (ref 1500–7800)
Neutrophils Relative %: 66 %
PLATELETS: 215 10*3/uL (ref 140–400)
RBC: 4.32 MIL/uL (ref 4.20–5.80)
RDW: 13.3 % (ref 11.0–15.0)
WBC: 7.2 10*3/uL (ref 3.8–10.8)

## 2015-12-30 LAB — COMPREHENSIVE METABOLIC PANEL
ALT: 20 U/L (ref 9–46)
AST: 23 U/L (ref 10–35)
Albumin: 3.9 g/dL (ref 3.6–5.1)
Alkaline Phosphatase: 43 U/L (ref 40–115)
BUN: 22 mg/dL (ref 7–25)
CHLORIDE: 106 mmol/L (ref 98–110)
CO2: 29 mmol/L (ref 20–31)
Calcium: 9.1 mg/dL (ref 8.6–10.3)
Creat: 1.19 mg/dL — ABNORMAL HIGH (ref 0.70–1.11)
GLUCOSE: 91 mg/dL (ref 70–99)
POTASSIUM: 4.4 mmol/L (ref 3.5–5.3)
Sodium: 141 mmol/L (ref 135–146)
Total Bilirubin: 0.4 mg/dL (ref 0.2–1.2)
Total Protein: 6.6 g/dL (ref 6.1–8.1)

## 2015-12-30 LAB — LIPID PANEL
CHOL/HDL RATIO: 5.1 ratio — AB (ref <5.0)
Cholesterol: 202 mg/dL — ABNORMAL HIGH (ref <200)
HDL: 40 mg/dL — ABNORMAL LOW (ref >40)
LDL CALC: 136 mg/dL — AB (ref <100)
Triglycerides: 130 mg/dL (ref <150)
VLDL: 26 mg/dL (ref <30)

## 2015-12-30 LAB — PSA: PSA: 9.2 ng/mL — AB (ref <=4.0)

## 2016-01-06 ENCOUNTER — Ambulatory Visit (INDEPENDENT_AMBULATORY_CARE_PROVIDER_SITE_OTHER): Payer: Medicare Other | Admitting: Family Medicine

## 2016-01-06 ENCOUNTER — Encounter: Payer: Self-pay | Admitting: Family Medicine

## 2016-01-06 VITALS — BP 104/60 | HR 60 | Temp 97.9°F | Resp 18 | Ht 71.0 in | Wt 204.0 lb

## 2016-01-06 DIAGNOSIS — Z0000 Encounter for general adult medical examination without abnormal findings: Secondary | ICD-10-CM | POA: Diagnosis not present

## 2016-01-06 DIAGNOSIS — Z23 Encounter for immunization: Secondary | ICD-10-CM

## 2016-01-06 NOTE — Addendum Note (Signed)
Addended by: Shary Decamp B on: 01/06/2016 11:17 AM   Modules accepted: Orders

## 2016-01-06 NOTE — Progress Notes (Signed)
Subjective:    Patient ID: Todd Riley, male    DOB: 16-Dec-1928, 82 y.o.   MRN: [id number]  HPI Subjective:   Patient presents for Medicare Annual/Subsequent preventive examination.  Overall his lab work etc. excellent. However his PSA has risen dramatically from 0.97 in July to 9.2 this month. He recently saw his urologist to resume his Lupron injections. They're scheduling a repeat his PSA in February. Overall he denies any new bone pain. He does have occasional low back pain attributed to degenerative disc disease which was seen on x-ray in 2016 however the bone pain has not recently intensified. Overall he is doing well. He is due for Pneumovax 23. Due to his advanced age, a colonoscopy is not clinically indicated Lab on 12/30/2015  Component Date Value Ref Range Status  . WBC 12/30/2015 7.2  3.8 - 10.8 K/uL Final  . RBC 12/30/2015 4.32  4.20 - 5.80 MIL/uL Final  . Hemoglobin 12/30/2015 13.0  13.0 - 17.0 g/dL Final  . HCT 12/30/2015 40.1  38.5 - 50.0 % Final  . MCV 12/30/2015 92.8  80.0 - 100.0 fL Final  . MCH 12/30/2015 30.1  27.0 - 33.0 pg Final  . MCHC 12/30/2015 32.4  32.0 - 36.0 g/dL Final  . RDW 12/30/2015 13.3  11.0 - 15.0 % Final  . Platelets 12/30/2015 215  140 - 400 K/uL Final  . MPV 12/30/2015 11.0  7.5 - 12.5 fL Final  . Neutro Abs 12/30/2015 4752  1,500 - 7,800 cells/uL Final  . Lymphs Abs 12/30/2015 1296  850 - 3,900 cells/uL Final  . Monocytes Absolute 12/30/2015 576  200 - 950 cells/uL Final  . Eosinophils Absolute 12/30/2015 504* 15 - 500 cells/uL Final  . Basophils Absolute 12/30/2015 72  0 - 200 cells/uL Final  . Neutrophils Relative % 12/30/2015 66  % Final  . Lymphocytes Relative 12/30/2015 18  % Final  . Monocytes Relative 12/30/2015 8  % Final  . Eosinophils Relative 12/30/2015 7  % Final  . Basophils Relative 12/30/2015 1  % Final  . Smear Review 12/30/2015 Criteria for review not met   Final  . Sodium 12/30/2015 141  135 - 146 mmol/L Final  .  Potassium 12/30/2015 4.4  3.5 - 5.3 mmol/L Final  . Chloride 12/30/2015 106  98 - 110 mmol/L Final  . CO2 12/30/2015 29  20 - 31 mmol/L Final  . Glucose, Bld 12/30/2015 91  70 - 99 mg/dL Final  . BUN 12/30/2015 22  7 - 25 mg/dL Final  . Creat 12/30/2015 1.19* 0.70 - 1.11 mg/dL Final   Comment:   For patients > or = 80 years of age: The upper reference limit for Creatinine is approximately 13% higher for people identified as African-American.     . Total Bilirubin 12/30/2015 0.4  0.2 - 1.2 mg/dL Final  . Alkaline Phosphatase 12/30/2015 43  40 - 115 U/L Final  . AST 12/30/2015 23  10 - 35 U/L Final  . ALT 12/30/2015 20  9 - 46 U/L Final  . Total Protein 12/30/2015 6.6  6.1 - 8.1 g/dL Final  . Albumin 12/30/2015 3.9  3.6 - 5.1 g/dL Final  . Calcium 12/30/2015 9.1  8.6 - 10.3 mg/dL Final  . Cholesterol 12/30/2015 202* <200 mg/dL Final   Comment: ** Please note change in reference range(s). **     . Triglycerides 12/30/2015 130  <150 mg/dL Final   Comment: ** Please note change in reference range(s). **     .  HDL 12/30/2015 40* >40 mg/dL Final   Comment: ** Please note change in reference range(s). **     . Total CHOL/HDL Ratio 12/30/2015 5.1* <5.0 Ratio Final  . VLDL 12/30/2015 26  <30 mg/dL Final  . LDL Cholesterol 12/30/2015 136* <100 mg/dL Final   Comment: ** Please note change in reference range(s). **     . PSA 12/30/2015 9.2* <=4.0 ng/mL Final   Comment:   The total PSA value from this assay system is standardized against the WHO standard. The test result will be approximately 20% lower when compared to the equimolar-standardized total PSA (Beck[employer] Coulter). Comparison of serial PSA results should be interpreted with this fact in mind.   This test was performed using the Siemens chemiluminescent method. Values obtained from different assay methods cannot be used interchangeably. PSA levels, regardless of value, should not be interpreted as absolute evidence of the  presence or absence of disease.         Review Past Medical/Family/Social:  Past Medical History:  Diagnosis Date  . GERD (gastroesophageal reflux disease)   . Hyperlipidemia   . Hypertension   . Prostate cancer [facility])    Past Surgical History:  Procedure Laterality Date  . CARDIOVASCULAR STRESS TEST  06/07/2006   ef 56%, no ischemia  . PROSTATE SURGERY    . ROTATOR CUFF REPAIR    . SHOULDER SURGERY     Current Outpatient Prescriptions on File Prior to Visit  Medication Sig Dispense Refill  . aspirin 81 MG tablet Take 81 mg by mouth daily.    Marland Kitchen atenolol (TENORMIN) 25 MG tablet TAKE 1 TABLET BY MOUTH DAILY 90 tablet 3  . calcium carbonate (OS-CAL) 600 MG TABS Take 600 mg by mouth 2 (two) times daily with a meal.    . leuprolide (LUPRON) 30 MG injection Inject 30 mg into the muscle every 4 (four) months.    . Multiple Vitamin (MULTIVITAMIN) tablet Take 1 tablet by mouth daily.    . multivitamin-lutein (OCUVITE-LUTEIN) CAPS Take 1 capsule by mouth daily.    Marland Kitchen omeprazole (PRILOSEC) 20 MG capsule Take 10 mg by mouth daily.    . psyllium (METAMUCIL) 58.6 % powder Take 1 packet by mouth 3 (three) times daily.    . valsartan-hydrochlorothiazide (DIOVAN-HCT) 160-25 MG tablet TAKE 1 TABLET BY MOUTH DAILY 90 tablet 1   No current facility-administered medications on file prior to visit.    Allergies  Allergen Reactions  . Sulfa Antibiotics    Social History   Social History  . Marital status: Single    Spouse name: N/A  . Number of children: N/A  . Years of education: N/A   Occupational History  . Not on file.   Social History Main Topics  . Smoking status: Former Smoker    Quit date: 06/16/1994  . Smokeless tobacco: Not on file  . Alcohol use No  . Drug use: No  . Sexual activity: Not on file   Other Topics Concern  . Not on file   Social History Narrative  . No narrative on file   No family history on file.  Depression Screen  (Note: if answer to either of  the following is "Yes", a more complete depression screening is indicated)  Over the past two weeks, have you felt down, depressed or hopeless? No Over the past two weeks, have you felt little interest or pleasure in doing things? No Have you lost interest or pleasure in daily life? No Do you often  feel hopeless? No Do you cry easily over simple problems? No   Activities of Daily Living  In your present state of health, do you have any difficulty performing the following activities?:  Driving? No  Managing money? No  Feeding yourself? No  Getting from bed to chair? No  Climbing a flight of stairs? No  Preparing food and eating?: No  Bathing or showering? No  Getting dressed: No  Getting to the toilet? No  Using the toilet:No  Moving around from place to place: No  In the past year have you fallen or had a near fall?:No  Are you sexually active? No  Do you have more than one partner? No   Hearing Difficulties: No  Do you often ask people to speak up or repeat themselves? No  Do you experience ringing or noises in your ears? No Do you have difficulty understanding soft or whispered voices? No  Do you feel that you have a problem with memory? No Do you often misplace items? No  Do you feel safe at home? Yes  Cognitive Testing  Alert? Yes Normal Appearance?Yes  Oriented to person? Yes Place? Yes  Time? Yes  Recall of three objects? Yes  Can perform simple calculations? Yes  Displays appropriate judgment?Yes  Can read the correct time from a watch face?Yes    Screening Tests / Date Colonoscopy    2013                 Zostavax utd Pneumovax utd Influenza Vaccine utd Prevnar due  Review of Systems  All other systems reviewed and are negative.      Objective:   Physical Exam  Constitutional: He is oriented to person, place, and time. He appears well-developed and well-nourished. No distress.  HENT:  Head: Normocephalic and atraumatic.  Right Ear: External ear  normal.  Left Ear: External ear normal.  Nose: Nose normal.  Mouth/Throat: Oropharynx is clear and moist. No oropharyngeal exudate.  Eyes: Conjunctivae and EOM are normal. Pupils are equal, round, and reactive to light. Right eye exhibits no discharge. Left eye exhibits no discharge. No scleral icterus.  Neck: Normal range of motion. Neck supple. No JVD present. No tracheal deviation present. No thyromegaly present.  Cardiovascular: Normal rate, regular rhythm, normal heart sounds and intact distal pulses.  Exam reveals no gallop and no friction rub.   No murmur heard. Pulmonary/Chest: Effort normal and breath sounds normal. No stridor. No respiratory distress. He has no wheezes. He has no rales. He exhibits no tenderness.  Abdominal: Soft. Bowel sounds are normal. He exhibits no distension and no mass. There is no tenderness. There is no rebound and no guarding.  Musculoskeletal: Normal range of motion. He exhibits no edema or tenderness.  Lymphadenopathy:    He has no cervical adenopathy.  Neurological: He is alert and oriented to person, place, and time. He has normal reflexes. No cranial nerve deficit. He exhibits normal muscle tone. Coordination normal.  Skin: Skin is warm. No rash noted. No erythema. No pallor.  Psychiatric: He has a normal mood and affect. His behavior is normal. Judgment and thought content normal.  Vitals reviewed.         Assessment & Plan:  Gen. medical exam  Recheck PSA in February. Patient will notify me immediately should he develop new bone pain so that we can image the involved site to rule out bony metastasis. The remainder of his preventative care is up-to-date. Regular anticipatory guidance was provided.  I recommended Pneumovax 23 today.  Medicare Attestation  I have personally reviewed:  The patient's medical and social history  Their use of alcohol, tobacco or illicit drugs  Their current medications and supplements  The patient's functional  ability including ADLs,fall risks, home safety risks, cognitive, and hearing and visual impairment  Diet and physical activities  Evidence for depression or mood disorders  The patient's weight, height, BMI, and visual acuity have been recorded in the chart. I have made referrals, counseling, and provided education to the patient based on review of the above and I have provided the patient with a written personalized care plan for preventive services.

## 2016-01-08 ENCOUNTER — Other Ambulatory Visit: Payer: Self-pay | Admitting: Family Medicine

## 2016-04-02 ENCOUNTER — Encounter: Payer: Self-pay | Admitting: Family Medicine

## 2016-04-02 ENCOUNTER — Ambulatory Visit (INDEPENDENT_AMBULATORY_CARE_PROVIDER_SITE_OTHER): Payer: Medicare Other | Admitting: Family Medicine

## 2016-04-02 VITALS — BP 122/64 | HR 76 | Temp 97.8°F | Resp 14 | Ht 71.0 in | Wt 202.0 lb

## 2016-04-02 DIAGNOSIS — H6123 Impacted cerumen, bilateral: Secondary | ICD-10-CM

## 2016-04-02 NOTE — Progress Notes (Signed)
   Subjective:    Patient ID: Todd Riley, male    DOB: 03-05-1928, 83 y.o.   MRN: [id number]  HPI Patient has bilateral hearing loss requiring hearing aids. He was recently seen by his audiologist and was found to have bilateral cerumen impactions further worsening his hearing. He is here today requesting that we remove the cerumen impactions with irrigation Past Medical History:  Diagnosis Date  . GERD (gastroesophageal reflux disease)   . Hyperlipidemia   . Hypertension   . Prostate cancer [facility])    Past Surgical History:  Procedure Laterality Date  . CARDIOVASCULAR STRESS TEST  06/07/2006   ef 56%, no ischemia  . PROSTATE SURGERY    . ROTATOR CUFF REPAIR    . SHOULDER SURGERY     Current Outpatient Prescriptions on File Prior to Visit  Medication Sig Dispense Refill  . aspirin 81 MG tablet Take 81 mg by mouth daily.    Marland Kitchen atenolol (TENORMIN) 25 MG tablet TAKE 1 TABLET BY MOUTH DAILY 90 tablet 3  . calcium carbonate (OS-CAL) 600 MG TABS Take 600 mg by mouth 2 (two) times daily with a meal.    . leuprolide (LUPRON) 30 MG injection Inject 30 mg into the muscle every 4 (four) months.    . Multiple Vitamin (MULTIVITAMIN) tablet Take 1 tablet by mouth daily.    . multivitamin-lutein (OCUVITE-LUTEIN) CAPS Take 1 capsule by mouth daily.    Marland Kitchen omeprazole (PRILOSEC) 20 MG capsule Take 10 mg by mouth daily.    . psyllium (METAMUCIL) 58.6 % powder Take 1 packet by mouth 3 (three) times daily.    . valsartan-hydrochlorothiazide (DIOVAN-HCT) 160-25 MG tablet TAKE 1 TABLET BY MOUTH EVERY DAY 90 tablet 1   No current facility-administered medications on file prior to visit.    Allergies  Allergen Reactions  . Sulfa Antibiotics    Social History   Social History  . Marital status: Single    Spouse name: N/A  . Number of children: N/A  . Years of education: N/A   Occupational History  . Not on file.   Social History Main Topics  . Smoking status: Former Smoker    Quit date:  06/16/1994  . Smokeless tobacco: Never Used  . Alcohol use No  . Drug use: No  . Sexual activity: Not on file   Other Topics Concern  . Not on file   Social History Narrative  . No narrative on file      Review of Systems  All other systems reviewed and are negative.      Objective:   Physical Exam  Cardiovascular: Normal rate, regular rhythm and normal heart sounds.   Pulmonary/Chest: Effort normal and breath sounds normal.  Vitals reviewed.     Bilateral cerumen impaction L>R    Assessment & Plan:  Hearing loss due to cerumen impaction, bilateral  Cerumen impaction removed bilaterally without complication using irrigation/lavage.

## 2016-06-24 ENCOUNTER — Other Ambulatory Visit: Payer: Self-pay | Admitting: Family Medicine

## 2016-06-29 ENCOUNTER — Ambulatory Visit (INDEPENDENT_AMBULATORY_CARE_PROVIDER_SITE_OTHER): Payer: Medicare Other | Admitting: Family Medicine

## 2016-06-29 ENCOUNTER — Encounter: Payer: Self-pay | Admitting: Family Medicine

## 2016-06-29 VITALS — BP 110/48 | HR 80 | Temp 97.6°F | Resp 16 | Ht 71.0 in | Wt 200.0 lb

## 2016-06-29 DIAGNOSIS — S91209A Unspecified open wound of unspecified toe(s) with damage to nail, initial encounter: Secondary | ICD-10-CM | POA: Diagnosis not present

## 2016-06-29 NOTE — Progress Notes (Signed)
   Subjective:    Patient ID: Todd TUOHEY, male    DOB: 1928-09-08, 83 y.o.   MRN: [id number]  HPI On the patient's left foot, his fifth toe, he suffered a partial avulsion of the toenail while at the beach this weekend. Today the toenail is partially avulsed from the underlying nail matrix. There is no evidence of infection. However is hanging on surrounding tissue and clothing. Past Medical History:  Diagnosis Date  . GERD (gastroesophageal reflux disease)   . Hyperlipidemia   . Hypertension   . Prostate cancer [facility])    Past Surgical History:  Procedure Laterality Date  . CARDIOVASCULAR STRESS TEST  06/07/2006   ef 56%, no ischemia  . PROSTATE SURGERY    . ROTATOR CUFF REPAIR    . SHOULDER SURGERY     Current Outpatient Prescriptions on File Prior to Visit  Medication Sig Dispense Refill  . aspirin 81 MG tablet Take 81 mg by mouth daily.    Marland Kitchen atenolol (TENORMIN) 25 MG tablet TAKE ONE TABLET BY MOUTH ONCE DAILY 90 tablet 3  . calcium carbonate (OS-CAL) 600 MG TABS Take 600 mg by mouth 2 (two) times daily with a meal.    . leuprolide (LUPRON) 30 MG injection Inject 30 mg into the muscle every 4 (four) months.    . Multiple Vitamin (MULTIVITAMIN) tablet Take 1 tablet by mouth daily.    . multivitamin-lutein (OCUVITE-LUTEIN) CAPS Take 1 capsule by mouth daily.    Marland Kitchen omeprazole (PRILOSEC) 20 MG capsule Take 10 mg by mouth daily.    . psyllium (METAMUCIL) 58.6 % powder Take 1 packet by mouth 3 (three) times daily.    . valsartan-hydrochlorothiazide (DIOVAN-HCT) 160-25 MG tablet TAKE 1 TABLET BY MOUTH EVERY DAY 90 tablet 1   No current facility-administered medications on file prior to visit.    Allergies  Allergen Reactions  . Sulfa Antibiotics    Social History   Social History  . Marital status: Single    Spouse name: N/A  . Number of children: N/A  . Years of education: N/A   Occupational History  . Not on file.   Social History Main Topics  . Smoking status: Former  Smoker    Quit date: 06/16/1994  . Smokeless tobacco: Never Used  . Alcohol use No  . Drug use: No  . Sexual activity: Not on file   Other Topics Concern  . Not on file   Social History Narrative  . No narrative on file      Review of Systems  All other systems reviewed and are negative.      Objective:   Physical Exam  Cardiovascular: Normal rate, regular rhythm and normal heart sounds.   Pulmonary/Chest: Effort normal and breath sounds normal.  Vitals reviewed.  Partially avulsed left 5th toenail.         Assessment & Plan:  Avulsion of toenail, initial encounter  Patient elects to have the toenail removed. Using sterile technique, anesthesia was achieved with a digital block using a 0.1% lidocaine without epinephrine. A tourniquet was applied to the base of the toe. The toenail was then separated from the underlying matrix using an elevator. The toenail was then removed with gentle traction using a pair hemostats. The nailbed was then covered with Neosporin, wrapped and petroleum gauze, and wrapped in Coban. Patient tolerated the procedure well with no complication. Wound care was discussed.

## 2016-11-03 ENCOUNTER — Ambulatory Visit: Payer: Medicare Other

## 2016-11-06 ENCOUNTER — Ambulatory Visit (INDEPENDENT_AMBULATORY_CARE_PROVIDER_SITE_OTHER): Payer: Medicare Other | Admitting: *Deleted

## 2016-11-06 DIAGNOSIS — Z23 Encounter for immunization: Secondary | ICD-10-CM

## 2016-11-06 NOTE — Progress Notes (Signed)
Patient seen in office for Influenza Vaccination.   Tolerated IM administration well.   Immunization history updated.  

## 2017-01-09 ENCOUNTER — Other Ambulatory Visit: Payer: Self-pay | Admitting: Family Medicine

## 2017-01-11 NOTE — Telephone Encounter (Signed)
Medication refilled per protocol. 

## 2017-01-21 ENCOUNTER — Other Ambulatory Visit: Payer: Medicare Other

## 2017-01-21 ENCOUNTER — Other Ambulatory Visit: Payer: Self-pay | Admitting: Family Medicine

## 2017-01-21 DIAGNOSIS — I10 Essential (primary) hypertension: Secondary | ICD-10-CM

## 2017-01-21 DIAGNOSIS — Z8546 Personal history of malignant neoplasm of prostate: Secondary | ICD-10-CM

## 2017-01-21 DIAGNOSIS — Z79899 Other long term (current) drug therapy: Secondary | ICD-10-CM

## 2017-01-21 DIAGNOSIS — E785 Hyperlipidemia, unspecified: Secondary | ICD-10-CM

## 2017-01-22 LAB — CBC WITH DIFFERENTIAL/PLATELET
BASOS ABS: 41 {cells}/uL (ref 0–200)
Basophils Relative: 0.5 %
EOS ABS: 470 {cells}/uL (ref 15–500)
Eosinophils Relative: 5.8 %
HCT: 37.8 % — ABNORMAL LOW (ref 38.5–50.0)
Hemoglobin: 12.8 g/dL — ABNORMAL LOW (ref 13.2–17.1)
Lymphs Abs: 1790 cells/uL (ref 850–3900)
MCH: 30.3 pg (ref 27.0–33.0)
MCHC: 33.9 g/dL (ref 32.0–36.0)
MCV: 89.4 fL (ref 80.0–100.0)
MONOS PCT: 8.4 %
MPV: 11.6 fL (ref 7.5–12.5)
NEUTROS ABS: 5119 {cells}/uL (ref 1500–7800)
Neutrophils Relative %: 63.2 %
PLATELETS: 211 10*3/uL (ref 140–400)
RBC: 4.23 10*6/uL (ref 4.20–5.80)
RDW: 12.1 % (ref 11.0–15.0)
TOTAL LYMPHOCYTE: 22.1 %
WBC mixed population: 680 cells/uL (ref 200–950)
WBC: 8.1 10*3/uL (ref 3.8–10.8)

## 2017-01-22 LAB — COMPLETE METABOLIC PANEL WITH GFR
AG Ratio: 1.3 (calc) (ref 1.0–2.5)
ALBUMIN MSPROF: 3.7 g/dL (ref 3.6–5.1)
ALT: 26 U/L (ref 9–46)
AST: 25 U/L (ref 10–35)
Alkaline phosphatase (APISO): 45 U/L (ref 40–115)
BUN / CREAT RATIO: 17 (calc) (ref 6–22)
BUN: 22 mg/dL (ref 7–25)
CALCIUM: 9.1 mg/dL (ref 8.6–10.3)
CO2: 25 mmol/L (ref 20–32)
CREATININE: 1.3 mg/dL — AB (ref 0.70–1.11)
Chloride: 102 mmol/L (ref 98–110)
GFR, EST NON AFRICAN AMERICAN: 49 mL/min/{1.73_m2} — AB (ref >=60)
GFR, Est African American: 57 mL/min/{1.73_m2} — ABNORMAL LOW (ref >=60)
GLOBULIN: 2.9 g/dL (ref 1.9–3.7)
GLUCOSE: 92 mg/dL (ref 65–99)
Potassium: 4.2 mmol/L (ref 3.5–5.3)
SODIUM: 137 mmol/L (ref 135–146)
Total Bilirubin: 0.6 mg/dL (ref 0.2–1.2)
Total Protein: 6.6 g/dL (ref 6.1–8.1)

## 2017-01-22 LAB — LIPID PANEL
CHOLESTEROL: 169 mg/dL (ref <200)
HDL: 39 mg/dL — AB (ref >40)
LDL CHOLESTEROL (CALC): 109 mg/dL — AB
NON-HDL CHOLESTEROL (CALC): 130 mg/dL — AB (ref <130)
Total CHOL/HDL Ratio: 4.3 (calc) (ref <5.0)
Triglycerides: 99 mg/dL (ref <150)

## 2017-01-22 LAB — PSA: PSA: 1.3 ng/mL (ref <=4.0)

## 2017-01-26 ENCOUNTER — Encounter: Payer: Medicare Other | Admitting: Family Medicine

## 2017-03-01 ENCOUNTER — Ambulatory Visit (INDEPENDENT_AMBULATORY_CARE_PROVIDER_SITE_OTHER): Payer: Medicare Other | Admitting: Family Medicine

## 2017-03-01 ENCOUNTER — Encounter: Payer: Self-pay | Admitting: Family Medicine

## 2017-03-01 VITALS — BP 120/64 | HR 60 | Temp 98.7°F | Resp 14 | Ht 71.0 in | Wt 194.0 lb

## 2017-03-01 DIAGNOSIS — R5383 Other fatigue: Secondary | ICD-10-CM | POA: Diagnosis not present

## 2017-03-01 DIAGNOSIS — Z0000 Encounter for general adult medical examination without abnormal findings: Secondary | ICD-10-CM

## 2017-03-01 DIAGNOSIS — E860 Dehydration: Secondary | ICD-10-CM

## 2017-03-01 DIAGNOSIS — I10 Essential (primary) hypertension: Secondary | ICD-10-CM | POA: Diagnosis not present

## 2017-03-01 DIAGNOSIS — Z8546 Personal history of malignant neoplasm of prostate: Secondary | ICD-10-CM | POA: Diagnosis not present

## 2017-03-01 MED ORDER — OMEPRAZOLE 20 MG PO CPDR: 20.0000 mg | DELAYED_RELEASE_CAPSULE | Freq: Every day | ORAL | 3 refills | Status: AC

## 2017-03-01 NOTE — Progress Notes (Signed)
Subjective:    Patient ID: Todd Riley, male    DOB: 1928/05/15, 84 y.o.   MRN: [id number]  HPI Subjective:   Patient presents for Medicare Annual/Subsequent preventive examination.  Overall his lab work etc. excellent. However his creatinine has risen.  Patient states that he had a viral illness recently causing severe diarrhea that he believes made him dehydrated. He also complains of worsening fatigue. He complains of dizziness upon standing. He complains of lethargy. He is on Lupron injections for  prostate cancer.  These also contribute to his fatigue due to hypogonadism and low testosterone. Unfortunately he is not a candidate for testosterone replacement obviously due to the prostate cancer. He also complains of some numbness and tingling and burning in the distal tips of his feet. Examination of the feet reveal normal pulses bilaterally with no erythema or lesions. There are no ulcers or nonhealing wounds. Symptoms seem attributable to peripheral neuropathy Immunization History  Administered Date(s) Administered  . Influenza, High Dose Seasonal PF 11/06/2016  . Influenza,inj,Quad PF,6+ Mos 11/01/2012, 11/15/2014, 11/12/2015  . Pneumococcal Conjugate-13 12/24/2014  . Pneumococcal Polysaccharide-23 01/06/2016  . Td 08/21/2003, 11/19/2010  . Tdap 11/19/2010  . Zoster 09/14/2005    No visits with results within 1 Month(s) from this visit.  Latest known visit with results is:  Appointment on 01/21/2017  Component Date Value Ref Range Status  . Glucose, Bld 01/21/2017 92  65 - 99 mg/dL Final   Comment: .            Fasting reference interval .   . BUN 01/21/2017 22  7 - 25 mg/dL Final  . Creat 01/21/2017 1.30* 0.70 - 1.11 mg/dL Final   Comment: For patients >93 years of age, the reference limit for Creatinine is approximately 13% higher for people identified as African-American. .   . GFR, Est Non African American 01/21/2017 49* > OR = 60 mL/min/1.38m2 Final  . GFR, Est  African American 01/21/2017 57* > OR = 60 mL/min/1.22m2 Final  . BUN/Creatinine Ratio 01/21/2017 17  6 - 22 (calc) Final  . Sodium 01/21/2017 137  135 - 146 mmol/L Final  . Potassium 01/21/2017 4.2  3.5 - 5.3 mmol/L Final  . Chloride 01/21/2017 102  98 - 110 mmol/L Final  . CO2 01/21/2017 25  20 - 32 mmol/L Final  . Calcium 01/21/2017 9.1  8.6 - 10.3 mg/dL Final  . Total Protein 01/21/2017 6.6  6.1 - 8.1 g/dL Final  . Albumin 01/21/2017 3.7  3.6 - 5.1 g/dL Final  . Globulin 01/21/2017 2.9  1.9 - 3.7 g/dL (calc) Final  . AG Ratio 01/21/2017 1.3  1.0 - 2.5 (calc) Final  . Total Bilirubin 01/21/2017 0.6  0.2 - 1.2 mg/dL Final  . Alkaline phosphatase (APISO) 01/21/2017 45  40 - 115 U/L Final  . AST 01/21/2017 25  10 - 35 U/L Final  . ALT 01/21/2017 26  9 - 46 U/L Final  . Cholesterol 01/21/2017 169  <200 mg/dL Final  . HDL 01/21/2017 39* >40 mg/dL Final  . Triglycerides 01/21/2017 99  <150 mg/dL Final  . LDL Cholesterol (Calc) 01/21/2017 109* mg/dL (calc) Final   Comment: Reference range: <100 . Desirable range <100 mg/dL for primary prevention;   <70 mg/dL for patients with CHD or diabetic patients  with > or = 2 CHD risk factors. Marland Kitchen LDL-C is now calculated using the Martin-Hopkins  calculation, which is a validated novel method providing  better accuracy than the Friedewald equation  in the  estimation of LDL-C.  Cresenciano Genre et al. Annamaria Helling. 9983;382(50): 2061-2068  (http://education.[facility].com/faq/FAQ164)   . Total CHOL/HDL Ratio 01/21/2017 4.3  <5.0 (calc) Final  . Non-HDL Cholesterol (Calc) 01/21/2017 130* <130 mg/dL (calc) Final   Comment: For patients with diabetes plus 1 major ASCVD risk  factor, treating to a non-HDL-C goal of <100 mg/dL  (LDL-C of <70 mg/dL) is considered a therapeutic  option.   . WBC 01/21/2017 8.1  3.8 - 10.8 Thousand/uL Final  . RBC 01/21/2017 4.23  4.20 - 5.80 Million/uL Final  . Hemoglobin 01/21/2017 12.8* 13.2 - 17.1 g/dL Final  . HCT  01/21/2017 37.8* 38.5 - 50.0 % Final  . MCV 01/21/2017 89.4  80.0 - 100.0 fL Final  . MCH 01/21/2017 30.3  27.0 - 33.0 pg Final  . MCHC 01/21/2017 33.9  32.0 - 36.0 g/dL Final  . RDW 01/21/2017 12.1  11.0 - 15.0 % Final  . Platelets 01/21/2017 211  140 - 400 Thousand/uL Final  . MPV 01/21/2017 11.6  7.5 - 12.5 fL Final  . Neutro Abs 01/21/2017 5,119  1,500 - 7,800 cells/uL Final  . Lymphs Abs 01/21/2017 1,790  850 - 3,900 cells/uL Final  . WBC mixed population 01/21/2017 680  200 - 950 cells/uL Final  . Eosinophils Absolute 01/21/2017 470  15 - 500 cells/uL Final  . Basophils Absolute 01/21/2017 41  0 - 200 cells/uL Final  . Neutrophils Relative % 01/21/2017 63.2  % Final  . Total Lymphocyte 01/21/2017 22.1  % Final  . Monocytes Relative 01/21/2017 8.4  % Final  . Eosinophils Relative 01/21/2017 5.8  % Final  . Basophils Relative 01/21/2017 0.5  % Final  . PSA 01/21/2017 1.3  < OR = 4.0 ng/mL Final   Comment: The total PSA value from this assay system is  standardized against the WHO standard. The test  result will be approximately 20% lower when compared  to the equimolar-standardized total PSA (Beckman  Coulter). Comparison of serial PSA results should be  interpreted with this fact in mind. . This test was performed using the Siemens  chemiluminescent method. Values obtained from  different assay methods cannot be used interchangeably. PSA levels, regardless of value, should not be interpreted as absolute evidence of the presence or absence of disease.       Review Past Medical/Family/Social:  Past Medical History:  Diagnosis Date  . GERD (gastroesophageal reflux disease)   . Hyperlipidemia   . Hypertension   . Prostate cancer [facility])    Past Surgical History:  Procedure Laterality Date  . CARDIOVASCULAR STRESS TEST  06/07/2006   ef 56%, no ischemia  . PROSTATE SURGERY    . ROTATOR CUFF REPAIR    . SHOULDER SURGERY     Current Outpatient Medications on File Prior  to Visit  Medication Sig Dispense Refill  . aspirin 81 MG tablet Take 81 mg by mouth daily.    Marland Kitchen atenolol (TENORMIN) 25 MG tablet TAKE ONE TABLET BY MOUTH ONCE DAILY 90 tablet 3  . calcium carbonate (OS-CAL) 600 MG TABS Take 600 mg by mouth 2 (two) times daily with a meal.    . leuprolide (LUPRON) 30 MG injection Inject 30 mg into the muscle every 4 (four) months.    . Multiple Vitamin (MULTIVITAMIN) tablet Take 1 tablet by mouth daily.    . multivitamin-lutein (OCUVITE-LUTEIN) CAPS Take 1 capsule by mouth daily.    . psyllium (METAMUCIL) 58.6 % powder Take 1 packet by mouth 3 (  three) times daily.    . valsartan-hydrochlorothiazide (DIOVAN-HCT) 160-25 MG tablet TAKE 1 TABLET BY MOUTH EVERY DAY 90 tablet 1   No current facility-administered medications on file prior to visit.    Allergies  Allergen Reactions  . Sulfa Antibiotics    Social History   Socioeconomic History  . Marital status: Single    Spouse name: Not on file  . Number of children: Not on file  . Years of education: Not on file  . Highest education level: Not on file  Social Needs  . Financial resource strain: Not on file  . Food insecurity - worry: Not on file  . Food insecurity - inability: Not on file  . Transportation needs - medical: Not on file  . Transportation needs - non-medical: Not on file  Occupational History  . Not on file  Tobacco Use  . Smoking status: Former Smoker    Last attempt to quit: 06/16/1994    Years since quitting: 22.7  . Smokeless tobacco: Never Used  Substance and Sexual Activity  . Alcohol use: No  . Drug use: No  . Sexual activity: Not on file  Other Topics Concern  . Not on file  Social History Narrative  . Not on file   No family history on file.  Depression Screen  (Note: if answer to either of the following is "Yes", a more complete depression screening is indicated)  Over the past two weeks, have you felt down, depressed or hopeless? No Over the past two weeks, have  you felt little interest or pleasure in doing things? No Have you lost interest or pleasure in daily life? No Do you often feel hopeless? No Do you cry easily over simple problems? No   Activities of Daily Living  In your present state of health, do you have any difficulty performing the following activities?:  Driving? No  Managing money? No  Feeding yourself? No  Getting from bed to chair? No  Climbing a flight of stairs? No  Preparing food and eating?: No  Bathing or showering? No  Getting dressed: No  Getting to the toilet? No  Using the toilet:No  Moving around from place to place: No  In the past year have you fallen or had a near fall?:No  Are you sexually active? No  Do you have more than one partner? No   Hearing Difficulties: No  Do you often ask people to speak up or repeat themselves? No  Do you experience ringing or noises in your ears? No Do you have difficulty understanding soft or whispered voices? No  Do you feel that you have a problem with memory? No Do you often misplace items? No  Do you feel safe at home? Yes  Cognitive Testing  Alert? Yes Normal Appearance?Yes  Oriented to person? Yes Place? Yes  Time? Yes  Recall of three objects? Yes  Can perform simple calculations? Yes  Displays appropriate judgment?Yes  Can read the correct time from a watch face?Yes    Screening Tests / Date Colonoscopy    2013                 Zostavax utd Pneumovax utd Influenza Vaccine utd Prevnar due  Review of Systems  All other systems reviewed and are negative.      Objective:   Physical Exam  Constitutional: He is oriented to person, place, and time. He appears well-developed and well-nourished. No distress.  HENT:  Head: Normocephalic and atraumatic.  Right  Ear: External ear normal.  Left Ear: External ear normal.  Nose: Nose normal.  Mouth/Throat: Oropharynx is clear and moist. No oropharyngeal exudate.  Eyes: Conjunctivae and EOM are normal. Pupils  are equal, round, and reactive to light. Right eye exhibits no discharge. Left eye exhibits no discharge. No scleral icterus.  Neck: Normal range of motion. Neck supple. No JVD present. No tracheal deviation present. No thyromegaly present.  Cardiovascular: Normal rate, regular rhythm, normal heart sounds and intact distal pulses. Exam reveals no gallop and no friction rub.  No murmur heard. Pulmonary/Chest: Effort normal and breath sounds normal. No stridor. No respiratory distress. He has no wheezes. He has no rales. He exhibits no tenderness.  Abdominal: Soft. Bowel sounds are normal. He exhibits no distension and no mass. There is no tenderness. There is no rebound and no guarding.  Musculoskeletal: Normal range of motion. He exhibits no edema or tenderness.  Lymphadenopathy:    He has no cervical adenopathy.  Neurological: He is alert and oriented to person, place, and time. He has normal reflexes. No cranial nerve deficit. He exhibits normal muscle tone. Coordination normal.  Skin: Skin is warm. No rash noted. No erythema. No pallor.  Psychiatric: He has a normal mood and affect. His behavior is normal. Judgment and thought content normal.  Vitals reviewed.         Assessment & Plan:  Routine general medical examination at a health care facility  Essential hypertension  Fatigue, unspecified type  History of prostate cancer  Dehydration  PSA has dropped from 9-1.3. This is reassuring. I am concerned the patient is dehydrated explaining the rise in his creatinine. I recommended that he push fluids and that we recheck his renal function test in one week. Also believe that relative hypotension may be contributing some to his fatigue and therefore I recommended that he discontinue atenolol and recheck in one week. Also believe that hypergonadism due to the treatment for his prostate cancer screening contributing some as well. He also has some evidence of peripheral neuropathy on exam  but at the present time I would not recommend gabapentin due to potential side effects. Otherwise he is doing well. His lab work is excellent. His immunizations are up-to-date. He is not due for colonoscopy due to age. Recheck in one week

## 2017-03-08 ENCOUNTER — Ambulatory Visit (INDEPENDENT_AMBULATORY_CARE_PROVIDER_SITE_OTHER): Payer: Medicare Other | Admitting: Family Medicine

## 2017-03-08 ENCOUNTER — Encounter: Payer: Self-pay | Admitting: Family Medicine

## 2017-03-08 VITALS — BP 90/50 | HR 90 | Temp 97.9°F | Resp 14 | Ht 71.0 in | Wt 197.0 lb

## 2017-03-08 DIAGNOSIS — N289 Disorder of kidney and ureter, unspecified: Secondary | ICD-10-CM

## 2017-03-08 DIAGNOSIS — I952 Hypotension due to drugs: Secondary | ICD-10-CM | POA: Diagnosis not present

## 2017-03-08 LAB — BASIC METABOLIC PANEL WITH GFR
BUN/Creatinine Ratio: 16 (calc) (ref 6–22)
BUN: 22 mg/dL (ref 7–25)
CALCIUM: 10.2 mg/dL (ref 8.6–10.3)
CHLORIDE: 103 mmol/L (ref 98–110)
CO2: 30 mmol/L (ref 20–32)
Creat: 1.35 mg/dL — ABNORMAL HIGH (ref 0.70–1.11)
GFR, EST AFRICAN AMERICAN: 55 mL/min/{1.73_m2} — AB (ref >=60)
GFR, Est Non African American: 47 mL/min/{1.73_m2} — ABNORMAL LOW (ref >=60)
GLUCOSE: 109 mg/dL — AB (ref 65–99)
Potassium: 4.6 mmol/L (ref 3.5–5.3)
Sodium: 141 mmol/L (ref 135–146)

## 2017-03-08 LAB — EXTRA LAV TOP TUBE

## 2017-03-08 NOTE — Progress Notes (Signed)
Subjective:    Patient ID: Todd Riley, male    DOB: [birth year], 84 y.o.   MRN: [id number]  HPI  At the patient's last office visit, he was found to have mild renal insufficiency. I treated this to dehydration. He was also experiencing fatigue. I recommended discontinuing atenolol and drinking more fluids and rechecking in 1 week. He continues to have hypotension. Today on his exam, he even shows orthostatic hypotension. However he feels much better. His fatigue has improved. He has been trying to drink more fluids Past Medical History:  Diagnosis Date  . GERD (gastroesophageal reflux disease)   . Hyperlipidemia   . Hypertension   . Prostate cancer [facility])    Past Surgical History:  Procedure Laterality Date  . CARDIOVASCULAR STRESS TEST  06/07/2006   ef 56%, no ischemia  . PROSTATE SURGERY    . ROTATOR CUFF REPAIR    . SHOULDER SURGERY     Current Outpatient Medications on File Prior to Visit  Medication Sig Dispense Refill  . aspirin 81 MG tablet Take 81 mg by mouth daily.    . calcium carbonate (OS-CAL) 600 MG TABS Take 600 mg by mouth 2 (two) times daily with a meal.    . leuprolide (LUPRON) 30 MG injection Inject 30 mg into the muscle every 4 (four) months.    . Multiple Vitamin (MULTIVITAMIN) tablet Take 1 tablet by mouth daily.    . multivitamin-lutein (OCUVITE-LUTEIN) CAPS Take 1 capsule by mouth daily.    . Omega-3 Fatty Acids (FISH OIL) 1200 MG CAPS Take 1 capsule by mouth 2 (two) times daily.    Marland Kitchen omeprazole (PRILOSEC) 20 MG capsule Take 1 capsule (20 mg total) by mouth daily. 90 capsule 3  . psyllium (METAMUCIL) 58.6 % powder Take 1 packet by mouth 3 (three) times daily.    . valsartan-hydrochlorothiazide (DIOVAN-HCT) 160-25 MG tablet TAKE 1 TABLET BY MOUTH EVERY DAY 90 tablet 1  . atenolol (TENORMIN) 25 MG tablet TAKE ONE TABLET BY MOUTH ONCE DAILY (Patient not taking: Reported on 03/08/2017) 90 tablet 3   No current facility-administered medications on file prior to  visit.    Allergies  Allergen Reactions  . Sulfa Antibiotics    Social History   Socioeconomic History  . Marital status: Single    Spouse name: Not on file  . Number of children: Not on file  . Years of education: Not on file  . Highest education level: Not on file  Social Needs  . Financial resource strain: Not on file  . Food insecurity - worry: Not on file  . Food insecurity - inability: Not on file  . Transportation needs - medical: Not on file  . Transportation needs - non-medical: Not on file  Occupational History  . Not on file  Tobacco Use  . Smoking status: Former Smoker    Last attempt to quit: 06/16/1994    Years since quitting: 22.7  . Smokeless tobacco: Never Used  Substance and Sexual Activity  . Alcohol use: No  . Drug use: No  . Sexual activity: Not on file  Other Topics Concern  . Not on file  Social History Narrative  . Not on file     Review of Systems  All other systems reviewed and are negative.      Objective:   Physical Exam  Constitutional: He appears well-developed and well-nourished. No distress.  Neck: Neck supple.  Cardiovascular: Normal rate, regular rhythm and normal heart sounds.  No  murmur heard. Pulmonary/Chest: Effort normal and breath sounds normal. No respiratory distress. He has no wheezes. He has no rales.  Abdominal: Soft. Bowel sounds are normal.  Musculoskeletal: He exhibits no edema.  Skin: He is not diaphoretic.  Vitals reviewed.         Assessment & Plan:  Renal insufficiency - Plan: BASIC METABOLIC PANEL WITH GFR  Hypotension due to drugs  Patient appears to be hypotensive and overmedicated. Discontinue valsartan hydrochlorothiazide and recheck blood pressure in 2 weeks. Recheck renal function today.

## 2017-03-10 ENCOUNTER — Ambulatory Visit: Payer: Medicare Other | Admitting: Family Medicine

## 2017-03-10 VITALS — BP 130/62

## 2017-03-10 DIAGNOSIS — I10 Essential (primary) hypertension: Secondary | ICD-10-CM

## 2017-03-11 ENCOUNTER — Other Ambulatory Visit: Payer: Self-pay

## 2017-03-11 ENCOUNTER — Telehealth: Payer: Self-pay | Admitting: *Deleted

## 2017-03-11 ENCOUNTER — Ambulatory Visit (INDEPENDENT_AMBULATORY_CARE_PROVIDER_SITE_OTHER): Payer: Medicare Other | Admitting: *Deleted

## 2017-03-11 VITALS — BP 142/68

## 2017-03-11 DIAGNOSIS — I10 Essential (primary) hypertension: Secondary | ICD-10-CM | POA: Diagnosis not present

## 2017-03-11 NOTE — Telephone Encounter (Signed)
Patient in office to have BP monitored per MD recommendations.   Reports that he has stopped valsartan HCTZ. States that he is feeling much better since stopping medication.   BP readings: 138/ 62 sitting 142/ 68 standing.   MD to be made aware.

## 2017-03-11 NOTE — Telephone Encounter (Signed)
These are good enough.  STay off meds for now.

## 2017-03-12 ENCOUNTER — Ambulatory Visit: Payer: Medicare Other

## 2017-03-12 VITALS — BP 136/64

## 2017-03-12 DIAGNOSIS — Z0130 Encounter for examination of blood pressure without abnormal findings: Secondary | ICD-10-CM

## 2017-03-15 ENCOUNTER — Ambulatory Visit: Payer: Medicare Other | Admitting: Family Medicine

## 2017-03-15 VITALS — BP 118/62

## 2017-03-15 DIAGNOSIS — I10 Essential (primary) hypertension: Secondary | ICD-10-CM

## 2017-03-16 ENCOUNTER — Ambulatory Visit: Payer: Medicare Other

## 2017-03-16 VITALS — BP 134/74

## 2017-03-16 DIAGNOSIS — I10 Essential (primary) hypertension: Secondary | ICD-10-CM

## 2017-03-17 ENCOUNTER — Ambulatory Visit: Payer: Medicare Other | Admitting: Family Medicine

## 2017-03-17 VITALS — BP 126/62

## 2017-03-17 DIAGNOSIS — Z0130 Encounter for examination of blood pressure without abnormal findings: Secondary | ICD-10-CM

## 2017-03-18 ENCOUNTER — Ambulatory Visit: Payer: Medicare Other | Admitting: *Deleted

## 2017-03-18 VITALS — BP 138/66

## 2017-03-18 DIAGNOSIS — Z0130 Encounter for examination of blood pressure without abnormal findings: Secondary | ICD-10-CM

## 2017-03-18 NOTE — Progress Notes (Signed)
Patient in office to have BP checked.   Noted BP readings as follows: 132/ 64~ sitting  138/ 66~ standing.   MD to be made aware.

## 2017-03-22 ENCOUNTER — Encounter: Payer: Self-pay | Admitting: Family Medicine

## 2017-03-22 ENCOUNTER — Ambulatory Visit: Payer: Medicare Other | Admitting: Family Medicine

## 2017-03-22 VITALS — BP 152/78 | HR 80 | Temp 97.9°F | Resp 16 | Ht 71.0 in | Wt 198.0 lb

## 2017-03-22 DIAGNOSIS — I952 Hypotension due to drugs: Secondary | ICD-10-CM | POA: Diagnosis not present

## 2017-03-22 DIAGNOSIS — I10 Essential (primary) hypertension: Secondary | ICD-10-CM | POA: Diagnosis not present

## 2017-03-22 DIAGNOSIS — N289 Disorder of kidney and ureter, unspecified: Secondary | ICD-10-CM | POA: Diagnosis not present

## 2017-03-22 LAB — BASIC METABOLIC PANEL WITH GFR
BUN: 20 mg/dL (ref 7–25)
CALCIUM: 9.7 mg/dL (ref 8.6–10.3)
CO2: 30 mmol/L (ref 20–32)
CREATININE: 1.11 mg/dL (ref 0.70–1.11)
Chloride: 103 mmol/L (ref 98–110)
GFR, Est African American: 69 mL/min/{1.73_m2} (ref >=60)
GFR, Est Non African American: 60 mL/min/{1.73_m2} (ref >=60)
GLUCOSE: 86 mg/dL (ref 65–99)
Potassium: 4.7 mmol/L (ref 3.5–5.3)
SODIUM: 140 mmol/L (ref 135–146)

## 2017-03-22 LAB — EXTRA LAV TOP TUBE

## 2017-03-22 NOTE — Progress Notes (Signed)
Subjective:    Patient ID: Todd Riley, male    DOB: 1928/11/14, 84 y.o.   MRN: [id number]  HPI 03/08/17 At the patient's last office visit, he was found to have mild renal insufficiency. I treated this to dehydration. He was also experiencing fatigue. I recommended discontinuing atenolol and drinking more fluids and rechecking in 1 week. He continues to have hypotension. Today on his exam, he even shows orthostatic hypotension. However he feels much better. His fatigue has improved. He has been trying to drink more fluids.  At that time, my plan was: Patient appears to be hypotensive and overmedicated. Discontinue valsartan hydrochlorothiazide and recheck blood pressure in 2 weeks. Recheck renal function today.  03/22/17 The patient's blood pressure at home has been well controlled.  His blood pressure has been averaging 130-142/70-80.  He is checked his blood pressure more than 10 times.  His blood pressure here today is elevated.  However he was running late and believes he feels anxious and that his blood pressure is incorrectly high.  He denies any chest pain shortness of breath or dyspnea on exertion.  He does feel much better since discontinuation of his blood pressure medication Past Medical History:  Diagnosis Date  . GERD (gastroesophageal reflux disease)   . Hyperlipidemia   . Hypertension   . Prostate cancer [facility])    Past Surgical History:  Procedure Laterality Date  . CARDIOVASCULAR STRESS TEST  06/07/2006   ef 56%, no ischemia  . PROSTATE SURGERY    . ROTATOR CUFF REPAIR    . SHOULDER SURGERY     Current Outpatient Medications on File Prior to Visit  Medication Sig Dispense Refill  . aspirin 81 MG tablet Take 81 mg by mouth daily.    Marland Kitchen atenolol (TENORMIN) 25 MG tablet TAKE ONE TABLET BY MOUTH ONCE DAILY 90 tablet 3  . calcium carbonate (OS-CAL) 600 MG TABS Take 600 mg by mouth 2 (two) times daily with a meal.    . leuprolide (LUPRON) 30 MG injection Inject 30 mg into  the muscle every 4 (four) months.    . Multiple Vitamin (MULTIVITAMIN) tablet Take 1 tablet by mouth daily.    . multivitamin-lutein (OCUVITE-LUTEIN) CAPS Take 1 capsule by mouth daily.    . Omega-3 Fatty Acids (FISH OIL) 1200 MG CAPS Take 1 capsule by mouth 2 (two) times daily.    Marland Kitchen omeprazole (PRILOSEC) 20 MG capsule Take 1 capsule (20 mg total) by mouth daily. 90 capsule 3  . psyllium (METAMUCIL) 58.6 % powder Take 1 packet by mouth 3 (three) times daily.    . valsartan-hydrochlorothiazide (DIOVAN-HCT) 160-25 MG tablet TAKE 1 TABLET BY MOUTH EVERY DAY (Patient not taking: Reported on 03/22/2017) 90 tablet 1   No current facility-administered medications on file prior to visit.    Allergies  Allergen Reactions  . Sulfa Antibiotics    Social History   Socioeconomic History  . Marital status: Single    Spouse name: Not on file  . Number of children: Not on file  . Years of education: Not on file  . Highest education level: Not on file  Social Needs  . Financial resource strain: Not on file  . Food insecurity - worry: Not on file  . Food insecurity - inability: Not on file  . Transportation needs - medical: Not on file  . Transportation needs - non-medical: Not on file  Occupational History  . Not on file  Tobacco Use  . Smoking status: Former  Smoker    Last attempt to quit: 06/16/1994    Years since quitting: 22.7  . Smokeless tobacco: Never Used  Substance and Sexual Activity  . Alcohol use: No  . Drug use: No  . Sexual activity: Not on file  Other Topics Concern  . Not on file  Social History Narrative  . Not on file     Review of Systems  All other systems reviewed and are negative.      Objective:   Physical Exam  Constitutional: He appears well-developed and well-nourished. No distress.  Neck: Neck supple.  Cardiovascular: Normal rate, regular rhythm and normal heart sounds.  No murmur heard. Pulmonary/Chest: Effort normal and breath sounds normal. No  respiratory distress. He has no wheezes. He has no rales.  Abdominal: Soft. Bowel sounds are normal.  Musculoskeletal: He exhibits no edema.  Skin: He is not diaphoretic.  Vitals reviewed.         Assessment & Plan:  Benign essential HTN - Plan: BASIC METABOLIC PANEL WITH GFR  Hypotension due to drugs  Renal insufficiency Repeat a BMP to monitor for improvement of his renal insufficiency off medication.  I still believe that the patient was overmedicated and dehydrated as a cause of his fatigue, low blood pressure, and increase in his creatinine.  His blood pressures at home are outstanding.  I will have the patient continue to check his blood pressure every day.  He can come by here randomly as well and we will be glad to check his blood pressure.  However as long as his blood pressure remains less than 140/90, I would not resume his medication.  Blood pressure here today I believe is fictitiously elevated due to stress and rushing to make his appointment

## 2017-03-25 ENCOUNTER — Other Ambulatory Visit: Payer: Self-pay

## 2017-03-25 ENCOUNTER — Ambulatory Visit: Payer: Medicare Other | Admitting: *Deleted

## 2017-03-25 VITALS — BP 132/64

## 2017-03-25 DIAGNOSIS — I10 Essential (primary) hypertension: Secondary | ICD-10-CM

## 2017-03-25 NOTE — Progress Notes (Signed)
Patient in office to have BP checked.   Noted BP readings as follows: 128/60~ sitting  132/64~ standing.   Automatic BP monitor readings are as follows: 138/ 79~ sitting  143/87~ standing.   MD to be made aware.

## 2017-04-08 ENCOUNTER — Telehealth: Payer: Self-pay | Admitting: Family Medicine

## 2017-04-08 ENCOUNTER — Ambulatory Visit (INDEPENDENT_AMBULATORY_CARE_PROVIDER_SITE_OTHER): Payer: Medicare Other | Admitting: *Deleted

## 2017-04-08 ENCOUNTER — Telehealth: Payer: Self-pay | Admitting: *Deleted

## 2017-04-08 VITALS — BP 142/70

## 2017-04-08 DIAGNOSIS — I10 Essential (primary) hypertension: Secondary | ICD-10-CM

## 2017-04-08 MED ORDER — HYDROCHLOROTHIAZIDE 25 MG PO TABS: 25.0000 mg | ORAL_TABLET | Freq: Every day | ORAL | 3 refills | Status: DC

## 2017-04-08 NOTE — Telephone Encounter (Signed)
I would resume hctz 25 mg a day for elevated bp.  He needs some help, just not three drugs like before.

## 2017-04-08 NOTE — Telephone Encounter (Signed)
Patient in office to monitor BP. Readings are as follows: 2/8- 160/83 2/10- 154/82 2/11- 145/73 2/13-  140/86 2/15- 123/75 2/17- 148/76  10 day average 139/79  2/21- 144/76- sitting  142/40- standing  Patient reports that he is feeling much improved. States that fatigue has resolved and he is no longer having vertigo. MD to be made aware.

## 2017-04-08 NOTE — Telephone Encounter (Signed)
Call placed to patient and patient made aware.   Prescription sent to pharmacy.  

## 2017-04-08 NOTE — Telephone Encounter (Signed)
Per chart notes, PCP had stopped all BP medications by 03/22/2017.  NO was given today for HCTZ 25mg  ONLY.   Call placed to patient and patient made aware. Advised that he remove other medications from same area he has current medications in.

## 2017-04-08 NOTE — Telephone Encounter (Signed)
Pt went to pharmacy and picked up his hydrochlorothiazide, has questions about the med since it is new. He needs to know if he has to discontinue his other med or take it in addition to the new med?

## 2017-07-15 ENCOUNTER — Ambulatory Visit: Payer: Medicare Other | Admitting: Family Medicine

## 2017-07-15 ENCOUNTER — Encounter: Payer: Self-pay | Admitting: Family Medicine

## 2017-07-15 VITALS — BP 124/60 | HR 82 | Temp 97.8°F | Resp 14 | Ht 71.0 in | Wt 191.0 lb

## 2017-07-15 DIAGNOSIS — J019 Acute sinusitis, unspecified: Secondary | ICD-10-CM | POA: Diagnosis not present

## 2017-07-15 DIAGNOSIS — B9689 Other specified bacterial agents as the cause of diseases classified elsewhere: Secondary | ICD-10-CM

## 2017-07-15 MED ORDER — AMOXICILLIN-POT CLAVULANATE 875-125 MG PO TABS: 1.0000 | ORAL_TABLET | Freq: Two times a day (BID) | ORAL | 0 refills | Status: DC

## 2017-07-15 MED ORDER — FLUTICASONE PROPIONATE 50 MCG/ACT NA SUSP: 2.0000 | Freq: Every day | NASAL | 6 refills | Status: DC

## 2017-07-15 NOTE — Progress Notes (Signed)
Subjective:    Patient ID: Todd Riley, male    DOB: 1928/10/15, 84 y.o.   MRN: [id number]  HPI Patient reports a 2-week history of pain and pressure in his frontal and maxillary sinus area.  He reports rhinorrhea and sneezing.  He reports postnasal drip causing a cough to be productive of green and yellow sputum.  He denies any shortness of breath although he does report some mild substernal chest pain due to frequent coughing.  Majority of his pain and pressure however is located in his frontal sinuses and has been present for more than 2 weeks.  He denies any shortness of breath.  He denies hemoptysis.  He denies purulent sputum.  He denies any objective fevers Past Medical History:  Diagnosis Date  . GERD (gastroesophageal reflux disease)   . Hyperlipidemia   . Hypertension   . Prostate cancer [facility])    Past Surgical History:  Procedure Laterality Date  . CARDIOVASCULAR STRESS TEST  06/07/2006   ef 56%, no ischemia  . PROSTATE SURGERY    . ROTATOR CUFF REPAIR    . SHOULDER SURGERY     Current Outpatient Medications on File Prior to Visit  Medication Sig Dispense Refill  . aspirin 81 MG tablet Take 81 mg by mouth daily.    . calcium carbonate (OS-CAL) 600 MG TABS Take 600 mg by mouth 2 (two) times daily with a meal.    . hydrochlorothiazide (HYDRODIURIL) 25 MG tablet Take 1 tablet (25 mg total) by mouth daily. 90 tablet 3  . leuprolide (LUPRON) 30 MG injection Inject 30 mg into the muscle every 4 (four) months.    . Multiple Vitamin (MULTIVITAMIN) tablet Take 1 tablet by mouth daily.    . multivitamin-lutein (OCUVITE-LUTEIN) CAPS Take 1 capsule by mouth daily.    . Omega-3 Fatty Acids (FISH OIL) 1200 MG CAPS Take 1 capsule by mouth 2 (two) times daily.    Marland Kitchen omeprazole (PRILOSEC) 20 MG capsule Take 1 capsule (20 mg total) by mouth daily. 90 capsule 3   No current facility-administered medications on file prior to visit.    Allergies  Allergen Reactions  . Sulfa Antibiotics      Social History   Socioeconomic History  . Marital status: Single    Spouse name: Not on file  . Number of children: Not on file  . Years of education: Not on file  . Highest education level: Not on file  Occupational History  . Not on file  Social Needs  . Financial resource strain: Not on file  . Food insecurity:    Worry: Not on file    Inability: Not on file  . Transportation needs:    Medical: Not on file    Non-medical: Not on file  Tobacco Use  . Smoking status: Former Smoker    Last attempt to quit: 06/16/1994    Years since quitting: 23.0  . Smokeless tobacco: Never Used  Substance and Sexual Activity  . Alcohol use: No  . Drug use: No  . Sexual activity: Not on file  Lifestyle  . Physical activity:    Days per week: Not on file    Minutes per session: Not on file  . Stress: Not on file  Relationships  . Social connections:    Talks on phone: Not on file    Gets together: Not on file    Attends religious service: Not on file    Active member of club or organization: Not  on file    Attends meetings of clubs or organizations: Not on file    Relationship status: Not on file  . Intimate partner violence:    Fear of current or ex partner: Not on file    Emotionally abused: Not on file    Physically abused: Not on file    Forced sexual activity: Not on file  Other Topics Concern  . Not on file  Social History Narrative  . Not on file     Review of Systems  All other systems reviewed and are negative.      Objective:   Physical Exam  Constitutional: He appears well-developed and well-nourished. No distress.  HENT:  Nose: Mucosal edema and rhinorrhea present. Right sinus exhibits maxillary sinus tenderness and frontal sinus tenderness. Left sinus exhibits maxillary sinus tenderness and frontal sinus tenderness.  Neck: Neck supple.  Cardiovascular: Normal rate, regular rhythm and normal heart sounds.  No murmur heard. Pulmonary/Chest: Effort normal.  No respiratory distress. He has no wheezes. He has rhonchi in the left lower field. He has no rales.  Abdominal: Soft. Bowel sounds are normal.  Musculoskeletal: He exhibits no edema.  Skin: He is not diaphoretic.  Vitals reviewed.  Pain and pressure in his right frontal and right maxillary sinus.       Assessment & Plan:  Acute bacterial rhinosinusitis Begin Augmentin 875 mg p.o. twice daily for 10 days with Flonase 2 sprays each nostril daily and Zyrtec 10 mg a day and recheck in 2 weeks if no better or sooner if worse.

## 2017-08-16 ENCOUNTER — Encounter: Payer: Self-pay | Admitting: Family Medicine

## 2017-08-16 ENCOUNTER — Ambulatory Visit: Payer: Medicare Other | Admitting: Family Medicine

## 2017-08-16 VITALS — BP 112/58 | HR 85 | Temp 97.8°F | Resp 16 | Ht 71.0 in | Wt 190.0 lb

## 2017-08-16 DIAGNOSIS — R319 Hematuria, unspecified: Secondary | ICD-10-CM

## 2017-08-16 DIAGNOSIS — N3001 Acute cystitis with hematuria: Secondary | ICD-10-CM

## 2017-08-16 LAB — URINALYSIS, ROUTINE W REFLEX MICROSCOPIC
BILIRUBIN URINE: NEGATIVE
GLUCOSE, UA: NEGATIVE
Ketones, ur: NEGATIVE
Nitrite: NEGATIVE
Specific Gravity, Urine: 1.015 (ref 1.001–1.03)
Squamous Epithelial / LPF: NONE SEEN /HPF (ref <=5)
pH: 7 (ref 5.0–8.0)

## 2017-08-16 LAB — MICROSCOPIC MESSAGE

## 2017-08-16 MED ORDER — CIPROFLOXACIN HCL 500 MG PO TABS: 500.0000 mg | ORAL_TABLET | Freq: Two times a day (BID) | ORAL | 0 refills | Status: DC

## 2017-08-16 NOTE — Progress Notes (Signed)
Subjective:    Patient ID: Todd Riley, male    DOB: 06-30-1928, 84 y.o.   MRN: [id number]  HPI  06/28/17 Patient reports a 2-week history of pain and pressure in his frontal and maxillary sinus area.  He reports rhinorrhea and sneezing.  He reports postnasal drip causing a cough to be productive of green and yellow sputum.  He denies any shortness of breath although he does report some mild substernal chest pain due to frequent coughing.  Majority of his pain and pressure however is located in his frontal sinuses and has been present for more than 2 weeks.  He denies any shortness of breath.  He denies hemoptysis.  He denies purulent sputum.  He denies any objective fevers.  At that time, my plan was: Begin Augmentin 875 mg p.o. twice daily for 10 days with Flonase 2 sprays each nostril daily and Zyrtec 10 mg a day and recheck in 2 weeks if no better or sooner if worse.  08/16/17 Symptoms began Friday with a fever to 103.0.  He reports malaise, feeling weak and tired.  Saturday he woke up with a fever to 103 and vomited once.  Fever persisted.  Therefore he went to an urgent care where a chest x-ray was normal and a urinalysis reportedly showed blood but was otherwise normal.  Patient was diagnosed with a viral syndrome.  He states that on Sunday he again had a fever to 103.  This time he had gross hematuria.  Since that time, he has had mild dysuria and increased urinary frequency and urgency.  Urinalysis today shows +1 blood, +1 protein, negative nitrites, and +3 leukocyte esterase.  Patient has a history of prostatectomy for prostate cancer.  He denies any cough.  He denies any otalgia.  He denies any sore throat.  He denies any further nausea or vomiting or diarrhea.  He denies any tick bites or rashes.  He denies any neck stiffness or headache or sinus pain or runny nose or sneezing. Past Medical History:  Diagnosis Date  . GERD (gastroesophageal reflux disease)   . Hyperlipidemia   .  Hypertension   . Prostate cancer [facility])    Past Surgical History:  Procedure Laterality Date  . CARDIOVASCULAR STRESS TEST  06/07/2006   ef 56%, no ischemia  . PROSTATE SURGERY    . ROTATOR CUFF REPAIR    . SHOULDER SURGERY     Current Outpatient Medications on File Prior to Visit  Medication Sig Dispense Refill  . amoxicillin-clavulanate (AUGMENTIN) 875-125 MG tablet Take 1 tablet by mouth 2 (two) times daily. 20 tablet 0  . aspirin 81 MG tablet Take 81 mg by mouth daily.    . calcium carbonate (OS-CAL) 600 MG TABS Take 600 mg by mouth 2 (two) times daily with a meal.    . fluticasone (FLONASE) 50 MCG/ACT nasal spray Place 2 sprays into both nostrils daily. 16 g 6  . hydrochlorothiazide (HYDRODIURIL) 25 MG tablet Take 1 tablet (25 mg total) by mouth daily. 90 tablet 3  . leuprolide (LUPRON) 30 MG injection Inject 30 mg into the muscle every 4 (four) months.    . Multiple Vitamin (MULTIVITAMIN) tablet Take 1 tablet by mouth daily.    . multivitamin-lutein (OCUVITE-LUTEIN) CAPS Take 1 capsule by mouth daily.    . Omega-3 Fatty Acids (FISH OIL) 1200 MG CAPS Take 1 capsule by mouth 2 (two) times daily.    Marland Kitchen omeprazole (PRILOSEC) 20 MG capsule Take 1 capsule (20  mg total) by mouth daily. 90 capsule 3   No current facility-administered medications on file prior to visit.    Allergies  Allergen Reactions  . Sulfa Antibiotics    Social History   Socioeconomic History  . Marital status: Single    Spouse name: Not on file  . Number of children: Not on file  . Years of education: Not on file  . Highest education level: Not on file  Occupational History  . Not on file  Social Needs  . Financial resource strain: Not on file  . Food insecurity:    Worry: Not on file    Inability: Not on file  . Transportation needs:    Medical: Not on file    Non-medical: Not on file  Tobacco Use  . Smoking status: Former Smoker    Last attempt to quit: 06/16/1994    Years since quitting: 23.1  .  Smokeless tobacco: Never Used  Substance and Sexual Activity  . Alcohol use: No  . Drug use: No  . Sexual activity: Not on file  Lifestyle  . Physical activity:    Days per week: Not on file    Minutes per session: Not on file  . Stress: Not on file  Relationships  . Social connections:    Talks on phone: Not on file    Gets together: Not on file    Attends religious service: Not on file    Active member of club or organization: Not on file    Attends meetings of clubs or organizations: Not on file    Relationship status: Not on file  . Intimate partner violence:    Fear of current or ex partner: Not on file    Emotionally abused: Not on file    Physically abused: Not on file    Forced sexual activity: Not on file  Other Topics Concern  . Not on file  Social History Narrative  . Not on file     Review of Systems  All other systems reviewed and are negative.      Objective:   Physical Exam  Constitutional: He appears well-developed and well-nourished. No distress.  HENT:  Right Ear: Tympanic membrane, external ear and ear canal normal.  Left Ear: Tympanic membrane, external ear and ear canal normal.  Nose: No mucosal edema or rhinorrhea. Right sinus exhibits no maxillary sinus tenderness and no frontal sinus tenderness. Left sinus exhibits no maxillary sinus tenderness and no frontal sinus tenderness.  Mouth/Throat: Oropharynx is clear and moist. No oropharyngeal exudate.  Neck: Neck supple.  Cardiovascular: Normal rate, regular rhythm and normal heart sounds.  No murmur heard. Pulmonary/Chest: Effort normal. No respiratory distress. He has no wheezes. He has rhonchi in the left lower field. He has no rales.  Abdominal: Soft. Bowel sounds are normal. He exhibits no distension and no mass. There is no tenderness. There is no rebound and no guarding.  Musculoskeletal: He exhibits no edema.  Skin: He is not diaphoretic.  Vitals reviewed.         Assessment & Plan:   Hematuria, unspecified type - Plan: Urinalysis, Routine w reflex microscopic  Acute cystitis with hematuria - Plan: ciprofloxacin (CIPRO) 500 MG tablet  Urinalysis is abnormal and symptoms are concerning for urinary tract infection.  Begin Cipro 500 mg p.o. twice daily for 10 days particular given his high fever.  Discontinue hydrochlorothiazide and push fluids.  Reassess in 48 hours if no better or sooner if worse.  Will  send a urine culture.

## 2017-08-16 NOTE — Addendum Note (Signed)
Addended by: Shary Decamp B on: 08/16/2017 02:47 PM   Modules accepted: Orders

## 2017-08-18 LAB — URINE CULTURE
MICRO NUMBER:: 90781843
SPECIMEN QUALITY:: ADEQUATE

## 2017-08-20 ENCOUNTER — Ambulatory Visit: Payer: Medicare Other | Admitting: Family Medicine

## 2017-09-09 ENCOUNTER — Ambulatory Visit: Payer: Medicare Other | Admitting: Family Medicine

## 2017-09-09 ENCOUNTER — Encounter: Payer: Self-pay | Admitting: Family Medicine

## 2017-09-09 VITALS — BP 134/58 | HR 83 | Temp 97.7°F | Resp 14 | Ht 71.0 in | Wt 192.0 lb

## 2017-09-09 DIAGNOSIS — M25562 Pain in left knee: Secondary | ICD-10-CM | POA: Diagnosis not present

## 2017-09-09 NOTE — Progress Notes (Signed)
Subjective:    Patient ID: Todd Riley, male    DOB: [birth year], 84 y.o.   MRN: [id number]  HPI Patient reports the gradual onset of left knee pain.  Pain is primarily over the medial joint line.  He denies any falls or injuries.  There is a small joint effusion.  There is no locking or laxity to varus or valgus stress.  There is no bruising.  Pain has been insidious in onset and persistently getting worse.  However he has been walking awkwardly on his leg for quite some time due to the pain in his hip.  He believes this has aggravated his knee. Past Medical History:  Diagnosis Date  . GERD (gastroesophageal reflux disease)   . Hyperlipidemia   . Hypertension   . Prostate cancer [facility])    Past Surgical History:  Procedure Laterality Date  . CARDIOVASCULAR STRESS TEST  06/07/2006   ef 56%, no ischemia  . PROSTATE SURGERY    . ROTATOR CUFF REPAIR    . SHOULDER SURGERY     Current Outpatient Medications on File Prior to Visit  Medication Sig Dispense Refill  . aspirin 81 MG tablet Take 81 mg by mouth daily.    . calcium carbonate (OS-CAL) 600 MG TABS Take 600 mg by mouth 2 (two) times daily with a meal.    . ciprofloxacin (CIPRO) 500 MG tablet Take 1 tablet (500 mg total) by mouth 2 (two) times daily. 20 tablet 0  . hydrochlorothiazide (HYDRODIURIL) 25 MG tablet Take 1 tablet (25 mg total) by mouth daily. 90 tablet 3  . leuprolide (LUPRON) 30 MG injection Inject 30 mg into the muscle every 4 (four) months.    . Multiple Vitamin (MULTIVITAMIN) tablet Take 1 tablet by mouth daily.    . multivitamin-lutein (OCUVITE-LUTEIN) CAPS Take 1 capsule by mouth daily.    . Omega-3 Fatty Acids (FISH OIL) 1200 MG CAPS Take 1 capsule by mouth 2 (two) times daily.    Marland Kitchen omeprazole (PRILOSEC) 20 MG capsule Take 1 capsule (20 mg total) by mouth daily. 90 capsule 3   No current facility-administered medications on file prior to visit.    Allergies  Allergen Reactions  . Sulfa Antibiotics    Social  History   Socioeconomic History  . Marital status: Single    Spouse name: Not on file  . Number of children: Not on file  . Years of education: Not on file  . Highest education level: Not on file  Occupational History  . Not on file  Social Needs  . Financial resource strain: Not on file  . Food insecurity:    Worry: Not on file    Inability: Not on file  . Transportation needs:    Medical: Not on file    Non-medical: Not on file  Tobacco Use  . Smoking status: Former Smoker    Last attempt to quit: 06/16/1994    Years since quitting: 23.2  . Smokeless tobacco: Never Used  Substance and Sexual Activity  . Alcohol use: No  . Drug use: No  . Sexual activity: Not on file  Lifestyle  . Physical activity:    Days per week: Not on file    Minutes per session: Not on file  . Stress: Not on file  Relationships  . Social connections:    Talks on phone: Not on file    Gets together: Not on file    Attends religious service: Not on file  Active member of club or organization: Not on file    Attends meetings of clubs or organizations: Not on file    Relationship status: Not on file  . Intimate partner violence:    Fear of current or ex partner: Not on file    Emotionally abused: Not on file    Physically abused: Not on file    Forced sexual activity: Not on file  Other Topics Concern  . Not on file  Social History Narrative  . Not on file     Review of Systems  All other systems reviewed and are negative.      Objective:   Physical Exam  Constitutional: He appears well-developed and well-nourished. No distress.  Neck: Neck supple.  Cardiovascular: Normal rate, regular rhythm and normal heart sounds.  No murmur heard. Pulmonary/Chest: Effort normal and breath sounds normal. No respiratory distress. He has no wheezes. He has no rales.  Abdominal: Soft. Bowel sounds are normal.  Musculoskeletal: He exhibits no edema.  Skin: He is not diaphoretic.  Vitals  reviewed.   Tenderness to palpation over the medial and lateral joint line.  Crepitus on range of motion.  Walking with an antalgic gait      Assessment & Plan:  Acute pain of left knee I suspect the patient has osteoarthritis that has been exacerbated by the way he has been carrying his weight compensating for his hip pain.  Using sterile technique, we injected the left knee with a mixture of 2 cc lidocaine, 2 cc of Marcaine, and 2 cc of 40 mg/mL Kenalog.  Patient tolerated the procedure well without complication.  If the knee pain does not improve, given his history of metastatic prostate cancer, I would proceed with an x-ray of the knee

## 2017-11-16 ENCOUNTER — Ambulatory Visit (INDEPENDENT_AMBULATORY_CARE_PROVIDER_SITE_OTHER): Payer: Medicare Other

## 2017-11-16 DIAGNOSIS — Z23 Encounter for immunization: Secondary | ICD-10-CM

## 2017-11-16 NOTE — Progress Notes (Signed)
Patient was in office for high dose flu vaccine. Patient received the vaccine in his right deltoid.Patietn tolerated well

## 2017-11-18 ENCOUNTER — Encounter: Payer: Self-pay | Admitting: Family Medicine

## 2018-01-27 ENCOUNTER — Telehealth: Payer: Self-pay | Admitting: Family Medicine

## 2018-01-27 NOTE — Telephone Encounter (Signed)
absolutely

## 2018-01-27 NOTE — Telephone Encounter (Signed)
[phone] Patient requesting handicap placard

## 2018-01-27 NOTE — Telephone Encounter (Signed)
Ok to complete form.

## 2018-01-27 NOTE — Telephone Encounter (Signed)
Form completed and patient made aware to pick up on 01/28/2018.

## 2018-02-16 MED FILL — SHINGRIX 50 MCG SUS: 50 | 1 days supply | Qty: 1 | Fill #0

## 2018-02-23 ENCOUNTER — Encounter: Payer: Self-pay | Admitting: Family Medicine

## 2018-02-23 ENCOUNTER — Ambulatory Visit: Payer: Medicare Other | Admitting: Family Medicine

## 2018-02-23 VITALS — BP 106/62 | HR 96 | Temp 97.6°F | Resp 15 | Ht 71.0 in | Wt 195.4 lb

## 2018-02-23 DIAGNOSIS — R319 Hematuria, unspecified: Secondary | ICD-10-CM

## 2018-02-23 DIAGNOSIS — N390 Urinary tract infection, site not specified: Secondary | ICD-10-CM

## 2018-02-23 LAB — URINALYSIS, ROUTINE W REFLEX MICROSCOPIC
BILIRUBIN URINE: NEGATIVE
GLUCOSE, UA: NEGATIVE
KETONES UR: NEGATIVE
NITRITE: POSITIVE — AB
PH: 8.5 — AB (ref 5.0–8.0)
Specific Gravity, Urine: 1.012 (ref 1.001–1.03)
Squamous Epithelial / LPF: NONE SEEN /HPF (ref <=5)

## 2018-02-23 LAB — CBC WITH DIFFERENTIAL/PLATELET
Absolute Monocytes: 1376 cells/uL — ABNORMAL HIGH (ref 200–950)
BASOS ABS: 86 {cells}/uL (ref 0–200)
BASOS PCT: 0.5 %
EOS ABS: 206 {cells}/uL (ref 15–500)
Eosinophils Relative: 1.2 %
HCT: 38.7 % (ref 38.5–50.0)
Hemoglobin: 13.5 g/dL (ref 13.2–17.1)
Lymphs Abs: 1737 cells/uL (ref 850–3900)
MCH: 31.1 pg (ref 27.0–33.0)
MCHC: 34.9 g/dL (ref 32.0–36.0)
MCV: 89.2 fL (ref 80.0–100.0)
MONOS PCT: 8 %
MPV: 11.1 fL (ref 7.5–12.5)
NEUTROS ABS: 13794 {cells}/uL — AB (ref 1500–7800)
NEUTROS PCT: 80.2 %
PLATELETS: 227 10*3/uL (ref 140–400)
RBC: 4.34 10*6/uL (ref 4.20–5.80)
RDW: 12 % (ref 11.0–15.0)
TOTAL LYMPHOCYTE: 10.1 %
WBC: 17.2 10*3/uL — ABNORMAL HIGH (ref 3.8–10.8)

## 2018-02-23 LAB — BASIC METABOLIC PANEL WITH GFR
BUN / CREAT RATIO: 14 (calc) (ref 6–22)
BUN: 16 mg/dL (ref 7–25)
CO2: 29 mmol/L (ref 20–32)
Calcium: 9.5 mg/dL (ref 8.6–10.3)
Chloride: 99 mmol/L (ref 98–110)
Creat: 1.18 mg/dL — ABNORMAL HIGH (ref 0.70–1.11)
GFR, EST AFRICAN AMERICAN: 64 mL/min/{1.73_m2} (ref >=60)
GFR, EST NON AFRICAN AMERICAN: 55 mL/min/{1.73_m2} — AB (ref >=60)
Glucose, Bld: 93 mg/dL (ref 65–99)
POTASSIUM: 4.2 mmol/L (ref 3.5–5.3)
SODIUM: 138 mmol/L (ref 135–146)

## 2018-02-23 LAB — MICROSCOPIC MESSAGE

## 2018-02-23 MED ORDER — CEPHALEXIN 500 MG PO CAPS: 500.0000 mg | ORAL_CAPSULE | Freq: Four times a day (QID) | ORAL | 0 refills | Status: AC

## 2018-02-23 NOTE — Progress Notes (Signed)
Patient ID: Todd Riley, male    DOB: [birth year], 85 y.o.   MRN: [id number]  PCP: Susy Frizzle, MD  Chief Complaint  Patient presents with  . Urinary Tract Infection    C/o urinary frequency, and also describes something string like material with black end that he passed last night during urination. Also had some nausea     Subjective:   Todd Riley is a 85 y.o. male, presents to clinic with CC of dysuria, urinary frequency and urgency x 2 days.  He has associated hematuria and has had some solid material and stringy material in his urine stream.  He states he is going to the bathroom every hour, it feels funny, more urgent than usual, he believes he has had hematuria he has had pink-tinged urine in the pads he wears for a long history of stress incontinence status post prostatectomy 26 years ago.  He has had no difficulty initiating his urine stream, he denies any pelvic pain, back pain, nausea, vomiting, fever, decreased appetite, scrotal or testicular pain swelling rash or irritation.  He denies a history of UTIs or any urological procedures recently.  He has established with Dr. Junious Silk at [facility] urology, he is continued to be treated for prostate cancer with Lupron and urology is monitoring his PSA.  Despite him saying he has not had any urinary tract infections I do see positive urine culture from July 2019 which positive for E. coli and was pansensitive.  Patient has not had any weight changes, swelling.  He has had a cold for about 2 weeks and he wonders if that is related to his UTI.   Patient Active Problem List   Diagnosis Date Noted  . Essential hypertension 11/13/2013     Prior to Admission medications   Medication Sig Start Date End Date Taking? Authorizing Provider  aspirin 81 MG tablet Take 81 mg by mouth daily.   Yes [provider]  calcium carbonate (OS-CAL) 600 MG TABS Take 600 mg by mouth 2 (two) times daily with a meal.   Yes [provider]  ciprofloxacin (CIPRO) 500 MG tablet Take 1 tablet (500 mg total) by mouth 2 (two) times daily. 08/16/17  Yes Susy Frizzle, MD  hydrochlorothiazide (HYDRODIURIL) 25 MG tablet Take 1 tablet (25 mg total) by mouth daily. 04/08/17  Yes Susy Frizzle, MD  leuprolide (LUPRON) 30 MG injection Inject 30 mg into the muscle every 4 (four) months.   Yes [provider]  Multiple Vitamin (MULTIVITAMIN) tablet Take 1 tablet by mouth daily.   Yes [provider]  multivitamin-lutein (OCUVITE-LUTEIN) CAPS Take 1 capsule by mouth daily.   Yes [provider]  Omega-3 Fatty Acids (FISH OIL) 1200 MG CAPS Take 1 capsule by mouth 2 (two) times daily.   Yes [provider]  omeprazole (PRILOSEC) 20 MG capsule Take 1 capsule (20 mg total) by mouth daily. 03/01/17  Yes Susy Frizzle, MD     Allergies  Allergen Reactions  . Sulfa Antibiotics      No family history on file.   Social History   Socioeconomic History  . Marital status: Single    Spouse name: Not on file  . Number of children: Not on file  . Years of education: Not on file  . Highest education level: Not on file  Occupational History  . Not on file  Social Needs  . Financial resource strain: Not on file  . Food  insecurity:    Worry: Not on file    Inability: Not on file  . Transportation needs:    Medical: Not on file    Non-medical: Not on file  Tobacco Use  . Smoking status: Former Smoker    Last attempt to quit: 06/16/1994    Years since quitting: 23.7  . Smokeless tobacco: Never Used  Substance and Sexual Activity  . Alcohol use: No  . Drug use: No  . Sexual activity: Not on file  Lifestyle  . Physical activity:    Days per week: Not on file    Minutes per session: Not on file  . Stress: Not on file  Relationships  . Social connections:    Talks on phone: Not on file    Gets together: Not on file    Attends religious service: Not on file    Active member of  club or organization: Not on file    Attends meetings of clubs or organizations: Not on file    Relationship status: Not on file  . Intimate partner violence:    Fear of current or ex partner: Not on file    Emotionally abused: Not on file    Physically abused: Not on file    Forced sexual activity: Not on file  Other Topics Concern  . Not on file  Social History Narrative  . Not on file     Review of Systems  Constitutional: Negative.   HENT: Negative.   Eyes: Negative.   Respiratory: Negative.   Cardiovascular: Negative.   Gastrointestinal: Negative.   Endocrine: Negative.   Genitourinary: Negative.   Musculoskeletal: Negative.   Skin: Negative.   Allergic/Immunologic: Negative.   Neurological: Negative.   Hematological: Negative.   Psychiatric/Behavioral: Negative.   All other systems reviewed and are negative.      Objective:    Vitals:   02/23/18 1211  Pulse: 96  Resp: 15  Temp: 97.6 F (36.4 C)  TempSrc: Oral  SpO2: 95%  Weight: 195 lb 6 oz (88.6 kg)  Height: 5\' 11"  (1.803 m)      Physical Exam Vitals signs and nursing note reviewed.  Constitutional:      General: He is not in acute distress.    Appearance: Normal appearance. He is well-developed. He is not ill-appearing or toxic-appearing.     Comments: Well appearing elderly male, NAD, appears stated age  HENT:     Head: Normocephalic and atraumatic.     Nose: Nose normal.  Eyes:     General:        Right eye: No discharge.        Left eye: No discharge.     Conjunctiva/sclera: Conjunctivae normal.  Neck:     Trachea: No tracheal deviation.  Cardiovascular:     Rate and Rhythm: Normal rate and regular rhythm.     Pulses: Normal pulses.     Heart sounds: Normal heart sounds.  Pulmonary:     Effort: Pulmonary effort is normal. No respiratory distress.     Breath sounds: Normal breath sounds. No stridor. No wheezing, rhonchi or rales.  Abdominal:     General: Abdomen is flat. Bowel sounds  are normal. There is no distension.     Tenderness: There is no abdominal tenderness. There is no right CVA tenderness, left CVA tenderness, guarding or rebound.  Musculoskeletal: Normal range of motion.  Skin:    General: Skin is warm and dry.     Capillary Refill: Capillary  refill takes less than 2 seconds.     Findings: No rash.  Neurological:     Mental Status: He is alert and oriented to person, place, and time.     Motor: No abnormal muscle tone.     Coordination: Coordination normal.  Psychiatric:        Behavior: Behavior normal.     Results for orders placed or performed in visit on 02/23/18  Urinalysis, Routine w reflex microscopic  Result Value Ref Range   Color, Urine YELLOW YELLOW   APPearance CLOUDY (A) CLEAR   Specific Gravity, Urine 1.012 1.001 - 1.03   pH 8.5 (H) 5.0 - 8.0   Glucose, UA NEGATIVE NEGATIVE   Bilirubin Urine NEGATIVE NEGATIVE   Ketones, ur NEGATIVE NEGATIVE   Hgb urine dipstick 3+ (A) NEGATIVE   Protein, ur 3+ (A) NEGATIVE   Nitrite POSITIVE (A) NEGATIVE   Leukocytes, UA 2+ (A) NEGATIVE   WBC, UA > OR = 60 (A) 0 - 5 /HPF   RBC / HPF 10-20 (A) 0 - 2 /HPF   Squamous Epithelial / LPF NONE SEEN < OR = 5 /HPF   Bacteria, UA MODERATE (A) NONE SEEN /HPF  Microscopic Message  Result Value Ref Range   Note           Assessment & Plan:      ICD-10-CM   1. Urinary tract infection with hematuria, site unspecified N39.0 Urinalysis, Routine w reflex microscopic   R31.9 Urine Culture    BASIC METABOLIC PANEL WITH GFR    CBC with Differential    cephALEXin (KEFLEX) 500 MG capsule    Urinalysis, Routine w reflex microscopic    PT with 2 days urinary sx, passing solid material in urine last night, today urine has debride/brown precipitate in it, and UA is + for UTI.  He is status post prostatectomy 26 years ago, last UTI that I can see in the chart was about 6 months ago was positive for E. coli and pansensitive.  He is established with urology and is  due to see Dr. Junious Silk in about 6 weeks.  He denies any abdominal pain, pelvic pain, flank pain, change to appetite, energy, denies nausea vomiting fever.  Abdominal exam benign he has no CVA tenderness vital signs are stable.  Because of his age and because of the precipitate in his urine I have added basic labs to check kidney function and white count.  We will treat empirically with Keflex, culture pending, close follow-up in the next week with his PCP or myself.  If any difficulty clearing infection patient will need to follow-up sooner with urology.  Dan's return precautions and ER precautions to be seen sooner if any worsening, go to the ER with any nausea vomiting, inability to take in biotics, urinary obstruction/tension.   Delsa Grana, PA-C 02/23/18 12:38 PM

## 2018-02-23 NOTE — Patient Instructions (Signed)
Take the antibiotics and follow up in one week.     Come sooner if any worsening.    Go to the ER if you cannot urinate, if you are vomiting and cannot take medicine.  I would like to recheck your urine in about a week from now.     Urinary Tract Infection, Adult A urinary tract infection (UTI) is an infection of any part of the urinary tract. The urinary tract includes:  The kidneys.  The ureters.  The bladder.  The urethra. These organs make, store, and get rid of pee (urine) in the body. What are the causes? This is caused by germs (bacteria) in your genital area. These germs grow and cause swelling (inflammation) of your urinary tract. What increases the risk? You are more likely to develop this condition if:  You have a small, thin tube (catheter) to drain pee.  You cannot control when you pee or poop (incontinence).  You are male, and: ? You use these methods to prevent pregnancy: ? A medicine that kills sperm (spermicide). ? A device that blocks sperm (diaphragm). ? You have low levels of a male hormone (estrogen). ? You are pregnant.  You have genes that add to your risk.  You are sexually active.  You take antibiotic medicines.  You have trouble peeing because of: ? A prostate that is bigger than normal, if you are male. ? A blockage in the part of your body that drains pee from the bladder (urethra). ? A kidney stone. ? A nerve condition that affects your bladder (neurogenic bladder). ? Not getting enough to drink. ? Not peeing often enough.  You have other conditions, such as: ? Diabetes. ? A weak disease-fighting system (immune system). ? Sickle cell disease. ? Gout. ? Injury of the spine. What are the signs or symptoms? Symptoms of this condition include:  Needing to pee right away (urgently).  Peeing often.  Peeing small amounts often.  Pain or burning when peeing.  Blood in the pee.  Pee that smells bad or not like  normal.  Trouble peeing.  Pee that is cloudy.  Fluid coming from the vagina, if you are male.  Pain in the belly or lower back. Other symptoms include:  Throwing up (vomiting).  No urge to eat.  Feeling mixed up (confused).  Being tired and grouchy (irritable).  A fever.  Watery poop (diarrhea). How is this treated? This condition may be treated with:  Antibiotic medicine.  Other medicines.  Drinking enough water. Follow these instructions at home:  Medicines  Take over-the-counter and prescription medicines only as told by your doctor.  If you were prescribed an antibiotic medicine, take it as told by your doctor. Do not stop taking it even if you start to feel better. General instructions  Make sure you: ? Pee until your bladder is empty. ? Do not hold pee for a long time. ? Empty your bladder after sex. ? Wipe from front to back after pooping if you are a male. Use each tissue one time when you wipe.  Drink enough fluid to keep your pee pale yellow.  Keep all follow-up visits as told by your doctor. This is important. Contact a doctor if:  You do not get better after 1-2 days.  Your symptoms go away and then come back. Get help right away if:  You have very bad back pain.  You have very bad pain in your lower belly.  You have a fever.  You are sick to your stomach (nauseous).  You are throwing up. Summary  A urinary tract infection (UTI) is an infection of any part of the urinary tract.  This condition is caused by germs in your genital area.  There are many risk factors for a UTI. These include having a small, thin tube to drain pee and not being able to control when you pee or poop.  Treatment includes antibiotic medicines for germs.  Drink enough fluid to keep your pee pale yellow. This information is not intended to replace advice given to you by your health care provider. Make sure you discuss any questions you have with your  health care provider. Document Released: 07/22/2007 Document Revised: 08/12/2017 Document Reviewed: 08/12/2017 Elsevier Interactive Patient Education  2019 [employer].

## 2018-02-25 ENCOUNTER — Encounter: Payer: Self-pay | Admitting: Family Medicine

## 2018-02-25 LAB — URINE CULTURE
MICRO NUMBER:: 27930
SPECIMEN QUALITY:: ADEQUATE

## 2018-04-21 ENCOUNTER — Ambulatory Visit (INDEPENDENT_AMBULATORY_CARE_PROVIDER_SITE_OTHER): Payer: Medicare Other | Admitting: Family Medicine

## 2018-04-21 ENCOUNTER — Encounter: Payer: Self-pay | Admitting: Family Medicine

## 2018-04-21 VITALS — BP 100/70 | HR 94 | Temp 98.0°F | Resp 14 | Ht 71.0 in | Wt 200.0 lb

## 2018-04-21 DIAGNOSIS — M25562 Pain in left knee: Secondary | ICD-10-CM

## 2018-04-21 NOTE — Progress Notes (Signed)
Subjective:    Patient ID: Todd Riley, male    DOB: May 07, 1928, 85 y.o.   MRN: [id number]  HPI   08/2017 Patient reports the gradual onset of left knee pain.  Pain is primarily over the medial joint line.  He denies any falls or injuries.  There is a small joint effusion.  There is no locking or laxity to varus or valgus stress.  There is no bruising.  Pain has been insidious in onset and persistently getting worse.  However he has been walking awkwardly on his leg for quite some time due to the pain in his hip.  He believes this has aggravated his knee.  At that time, my plan was: I suspect the patient has osteoarthritis that has been exacerbated by the way he has been carrying his weight compensating for his hip pain.  Using sterile technique, we injected the left knee with a mixture of 2 cc lidocaine, 2 cc of Marcaine, and 2 cc of 40 mg/mL Kenalog.  Patient tolerated the procedure well without complication.  If the knee pain does not improve, given his history of metastatic prostate cancer, I would proceed with an x-ray of the knee  04/21/18 Patient reports sharp pain in the lateral aspect of his knee with standing and twisting.  Pain is been constant for the last 6 months.  He has a Baker's cyst today on exam with a moderate joint effusion.  Pain is made worse by prolonged standing and prolonged walking but twisting motions really exacerbate the pain.  He has a positive Apley grind today.  He has a Baker's cyst on exam and tenderness over the lateral joint line Past Medical History:  Diagnosis Date  . GERD (gastroesophageal reflux disease)   . Hyperlipidemia   . Hypertension   . Prostate cancer [facility])    Past Surgical History:  Procedure Laterality Date  . CARDIOVASCULAR STRESS TEST  06/07/2006   ef 56%, no ischemia  . PROSTATE SURGERY    . ROTATOR CUFF REPAIR    . SHOULDER SURGERY     Current Outpatient Medications on File Prior to Visit  Medication Sig Dispense Refill  . aspirin  81 MG tablet Take 81 mg by mouth daily.    . calcium carbonate (OS-CAL) 600 MG TABS Take 600 mg by mouth 2 (two) times daily with a meal.    . ciprofloxacin (CIPRO) 500 MG tablet Take 1 tablet (500 mg total) by mouth 2 (two) times daily. 20 tablet 0  . hydrochlorothiazide (HYDRODIURIL) 25 MG tablet Take 1 tablet (25 mg total) by mouth daily. 90 tablet 3  . leuprolide (LUPRON) 30 MG injection Inject 30 mg into the muscle every 4 (four) months.    . Multiple Vitamin (MULTIVITAMIN) tablet Take 1 tablet by mouth daily.    . multivitamin-lutein (OCUVITE-LUTEIN) CAPS Take 1 capsule by mouth daily.    . Omega-3 Fatty Acids (FISH OIL) 1200 MG CAPS Take 1 capsule by mouth 2 (two) times daily.    Marland Kitchen omeprazole (PRILOSEC) 20 MG capsule Take 1 capsule (20 mg total) by mouth daily. 90 capsule 3   No current facility-administered medications on file prior to visit.    Allergies  Allergen Reactions  . Sulfa Antibiotics    Social History   Socioeconomic History  . Marital status: Single    Spouse name: Not on file  . Number of children: Not on file  . Years of education: Not on file  . Highest education level:  Not on file  Occupational History  . Not on file  Social Needs  . Financial resource strain: Not on file  . Food insecurity:    Worry: Not on file    Inability: Not on file  . Transportation needs:    Medical: Not on file    Non-medical: Not on file  Tobacco Use  . Smoking status: Former Smoker    Last attempt to quit: 06/16/1994    Years since quitting: 23.8  . Smokeless tobacco: Never Used  Substance and Sexual Activity  . Alcohol use: No  . Drug use: No  . Sexual activity: Not on file  Lifestyle  . Physical activity:    Days per week: Not on file    Minutes per session: Not on file  . Stress: Not on file  Relationships  . Social connections:    Talks on phone: Not on file    Gets together: Not on file    Attends religious service: Not on file    Active member of club or  organization: Not on file    Attends meetings of clubs or organizations: Not on file    Relationship status: Not on file  . Intimate partner violence:    Fear of current or ex partner: Not on file    Emotionally abused: Not on file    Physically abused: Not on file    Forced sexual activity: Not on file  Other Topics Concern  . Not on file  Social History Narrative  . Not on file     Review of Systems  All other systems reviewed and are negative.      Objective:   Physical Exam  Constitutional: He appears well-developed and well-nourished. No distress.  Neck: Neck supple.  Cardiovascular: Normal rate, regular rhythm and normal heart sounds.  No murmur heard. Pulmonary/Chest: Effort normal and breath sounds normal. No respiratory distress. He has no wheezes. He has no rales.  Abdominal: Soft. Bowel sounds are normal.  Musculoskeletal:        General: No edema.     Left knee: He exhibits decreased range of motion, swelling, effusion and abnormal meniscus. Tenderness found. Lateral joint line tenderness noted.  Skin: He is not diaphoretic.  Vitals reviewed.   Tenderness to palpation over the medial and lateral joint line.  Crepitus on range of motion.  Walking with an antalgic gait      Assessment & Plan:   Acute pain of left knee  I believe the patient likely has osteoarthritis of the lateral compartment coupled with a meniscal tear.  Using sterile technique, I injected the left knee with 2 cc lidocaine, 2 cc of Marcaine, and 2 cc of 40 mg/mL Kenalog.  Patient tolerated the procedure well without complication.  If no better, we will consult orthopedics.

## 2018-04-25 MED FILL — SHINGRIX 50 MCG SUS: 50 | 1 days supply | Qty: 1 | Fill #1

## 2018-04-29 ENCOUNTER — Other Ambulatory Visit (HOSPITAL_COMMUNITY): Payer: Self-pay | Admitting: Urology

## 2018-04-29 DIAGNOSIS — C61 Malignant neoplasm of prostate: Secondary | ICD-10-CM

## 2018-05-06 ENCOUNTER — Other Ambulatory Visit: Payer: Self-pay | Admitting: Family Medicine

## 2018-05-06 MED ORDER — HYDROCHLOROTHIAZIDE 25 MG PO TABS: 25.0000 mg | ORAL_TABLET | Freq: Every day | ORAL | 3 refills | Status: DC

## 2018-05-10 ENCOUNTER — Telehealth (HOSPITAL_COMMUNITY): Payer: Self-pay

## 2018-05-10 NOTE — Telephone Encounter (Signed)
Lm for provider to return call regarding upcoming nm bone scan. AW

## 2018-05-12 ENCOUNTER — Encounter (HOSPITAL_COMMUNITY): Payer: Self-pay

## 2018-05-12 ENCOUNTER — Ambulatory Visit (HOSPITAL_COMMUNITY): Payer: Medicare Other

## 2018-05-18 ENCOUNTER — Other Ambulatory Visit: Payer: Self-pay | Admitting: Urology

## 2018-05-18 DIAGNOSIS — K862 Cyst of pancreas: Secondary | ICD-10-CM

## 2018-05-18 DIAGNOSIS — K7689 Other specified diseases of liver: Secondary | ICD-10-CM

## 2018-06-22 ENCOUNTER — Encounter (HOSPITAL_COMMUNITY): Payer: Medicare Other

## 2018-06-22 ENCOUNTER — Other Ambulatory Visit (HOSPITAL_COMMUNITY): Payer: Medicare Other

## 2018-06-29 ENCOUNTER — Ambulatory Visit (HOSPITAL_COMMUNITY)
Admission: RE | Admit: 2018-06-29 | Discharge: 2018-06-29 | Disposition: A | Discharge status: Home / Self Care | Payer: Medicare Other | Source: Ambulatory Visit | Attending: Urology | Admitting: Urology

## 2018-06-29 ENCOUNTER — Other Ambulatory Visit: Payer: Self-pay

## 2018-06-29 DIAGNOSIS — C61 Malignant neoplasm of prostate: Secondary | ICD-10-CM | POA: Diagnosis present

## 2018-06-29 MED ORDER — TECHNETIUM TC 99M MEDRONATE IV KIT
21.5000 | PACK | Freq: Once | INTRAVENOUS | Status: AC | PRN
  Administered 2018-06-29: 11:00:00 21.5 via INTRAVENOUS

## 2018-07-13 ENCOUNTER — Other Ambulatory Visit: Payer: Self-pay

## 2018-07-13 ENCOUNTER — Ambulatory Visit
Admission: RE | Admit: 2018-07-13 | Discharge: 2018-07-13 | Disposition: A | Discharge status: Home / Self Care | Payer: Medicare Other | Source: Ambulatory Visit | Attending: Urology | Admitting: Urology

## 2018-07-13 ENCOUNTER — Other Ambulatory Visit: Payer: Self-pay | Admitting: Urology

## 2018-07-13 DIAGNOSIS — K862 Cyst of pancreas: Secondary | ICD-10-CM

## 2018-07-13 DIAGNOSIS — Z77018 Contact with and (suspected) exposure to other hazardous metals: Secondary | ICD-10-CM

## 2018-07-13 DIAGNOSIS — K7689 Other specified diseases of liver: Secondary | ICD-10-CM

## 2018-07-13 MED ORDER — GADOBENATE DIMEGLUMINE 529 MG/ML IV SOLN
18.0000 mL | Freq: Once | INTRAVENOUS | Status: AC | PRN
  Administered 2018-07-13: 18 mL via INTRAVENOUS

## 2018-08-11 ENCOUNTER — Other Ambulatory Visit: Payer: Self-pay | Admitting: Urology

## 2018-08-11 DIAGNOSIS — M8580 Other specified disorders of bone density and structure, unspecified site: Secondary | ICD-10-CM

## 2018-09-26 ENCOUNTER — Other Ambulatory Visit: Payer: Self-pay

## 2018-09-26 ENCOUNTER — Ambulatory Visit (INDEPENDENT_AMBULATORY_CARE_PROVIDER_SITE_OTHER): Payer: Medicare Other | Admitting: Family Medicine

## 2018-09-26 DIAGNOSIS — R509 Fever, unspecified: Secondary | ICD-10-CM | POA: Diagnosis not present

## 2018-09-26 DIAGNOSIS — Z20822 Contact with and (suspected) exposure to covid-19: Secondary | ICD-10-CM

## 2018-09-26 MED ORDER — CIPROFLOXACIN HCL 500 MG PO TABS: 500.0000 mg | ORAL_TABLET | Freq: Two times a day (BID) | ORAL | 0 refills | Status: DC

## 2018-09-26 NOTE — Progress Notes (Signed)
Subjective:    Patient ID: Todd Riley, male    DOB: 19-Sep-1928, 85 y.o.   MRN: [id number]  HPI Patient is being seen today as a telephone visit.  He consents to be seen by telephone.  He is accompanied on the telephone by his wife who helps provide some of the history.  Phone call began at 1034.  Phone call concluded at 1047.  Patient began feeling poorly on Friday.  The symptoms are vague and nonspecific.  He states that he just felt bad.  He reports feeling weak and tired with no energy.  Saturday the feelings persisted but no specific symptom.  However by Saturday afternoon he developed a fever to 101.0 and diffuse chills.  Saturday night he developed pelvic pain right above his penis in the area of his bladder.  He was also having some pain in his testicles.  The pain persisted through Sunday.  However by Sunday the feeling had improved.  Unfortunately continues to have a low-grade fever of 100-1 01.  He is also experiencing chills and night sweats.  He denies any shortness of breath.  He denies any cough.  He denies any chest pain.  However he does have profound fatigue and poor energy.  The abdominal discomfort has improved however he continues to experience chills and fever.  He denies any nausea or vomiting or diarrhea.  He denies any constipation.  He denies any hematochezia.  He denies any dysuria although he has a history of frequent urinary tract infections.  Due to nerve damage, he often cannot feel symptoms of urinary tract infection.  He denies any recent tick bite.  He denies any rash.  He has not left his house in a week and has no possible exposure to COVID that he is aware of. Past Medical History:  Diagnosis Date  . GERD (gastroesophageal reflux disease)   . Hyperlipidemia   . Hypertension   . Prostate cancer [facility])    Past Surgical History:  Procedure Laterality Date  . CARDIOVASCULAR STRESS TEST  06/07/2006   ef 56%, no ischemia  . PROSTATE SURGERY    . ROTATOR CUFF  REPAIR    . SHOULDER SURGERY     Current Outpatient Medications on File Prior to Visit  Medication Sig Dispense Refill  . aspirin 81 MG tablet Take 81 mg by mouth daily.    . calcium carbonate (OS-CAL) 600 MG TABS Take 600 mg by mouth 2 (two) times daily with a meal.    . hydrochlorothiazide (HYDRODIURIL) 25 MG tablet Take 1 tablet (25 mg total) by mouth daily. 90 tablet 3  . leuprolide (LUPRON) 30 MG injection Inject 30 mg into the muscle every 4 (four) months.    . Multiple Vitamin (MULTIVITAMIN) tablet Take 1 tablet by mouth daily.    . multivitamin-lutein (OCUVITE-LUTEIN) CAPS Take 1 capsule by mouth daily.    . Omega-3 Fatty Acids (FISH OIL) 1200 MG CAPS Take 1 capsule by mouth 2 (two) times daily.    Marland Kitchen omeprazole (PRILOSEC) 20 MG capsule Take 1 capsule (20 mg total) by mouth daily. 90 capsule 3   No current facility-administered medications on file prior to visit.    Allergies  Allergen Reactions  . Sulfa Antibiotics    Social History   Socioeconomic History  . Marital status: Married    Spouse name: Not on file  . Number of children: Not on file  . Years of education: Not on file  . Highest education level:  Not on file  Occupational History  . Not on file  Social Needs  . Financial resource strain: Not on file  . Food insecurity    Worry: Not on file    Inability: Not on file  . Transportation needs    Medical: Not on file    Non-medical: Not on file  Tobacco Use  . Smoking status: Former Smoker    Quit date: 06/16/1994    Years since quitting: 24.2  . Smokeless tobacco: Never Used  Substance and Sexual Activity  . Alcohol use: No  . Drug use: No  . Sexual activity: Not on file  Lifestyle  . Physical activity    Days per week: Not on file    Minutes per session: Not on file  . Stress: Not on file  Relationships  . Social Herbalist on phone: Not on file    Gets together: Not on file    Attends religious service: Not on file    Active member of  club or organization: Not on file    Attends meetings of clubs or organizations: Not on file    Relationship status: Not on file  . Intimate partner violence    Fear of current or ex partner: Not on file    Emotionally abused: Not on file    Physically abused: Not on file    Forced sexual activity: Not on file  Other Topics Concern  . Not on file  Social History Narrative  . Not on file      Review of Systems  All other systems reviewed and are negative.      Objective:   Physical Exam   Physical exam could not be performed as the patient was seen as a telephone visit.  However he is on the telephone speaking full and complete sentences with no shortness of breath and no delirium.     Assessment & Plan:  The encounter diagnosis was Fever, unspecified fever cause. This is difficult to ascertain over the telephone as to the cause.  Differential diagnosis includes the COVID-19 virus.  However given his past medical history, I am also concerned about a urinary tract infection with systemic spread.  He is experienced this previously.  I am also concerned about possible diverticulitis given the pelvic pain although he denies any diarrhea.  Also the differential diagnosis could be tick fever given the chills, systemic symptoms, but no specific other symptom.  I will test the patient for COVID-19.  He is not experiencing any shortness of breath at the present time or cough and therefore I do not feel that he needs to go to the hospital right now.  We will get tested as an outpatient.  I will start the patient on Cipro 500 mg twice daily for 10 days to cover for possible urinary tract infection.  This would also give some coverage against colitis although I think this is less likely.  Family will monitor for any rash to suggest tick fever.  If he develops worsening fever or shortness of breath he is to go to the emergency room immediately.

## 2018-09-27 LAB — NOVEL CORONAVIRUS, NAA: SARS-CoV-2, NAA: NOT DETECTED

## 2018-10-27 ENCOUNTER — Ambulatory Visit (INDEPENDENT_AMBULATORY_CARE_PROVIDER_SITE_OTHER): Payer: Medicare Other | Admitting: Family Medicine

## 2018-10-27 ENCOUNTER — Other Ambulatory Visit: Payer: Self-pay

## 2018-10-27 DIAGNOSIS — S61219A Laceration without foreign body of unspecified finger without damage to nail, initial encounter: Secondary | ICD-10-CM

## 2018-10-27 DIAGNOSIS — Z23 Encounter for immunization: Secondary | ICD-10-CM

## 2018-10-27 DIAGNOSIS — S61209A Unspecified open wound of unspecified finger without damage to nail, initial encounter: Secondary | ICD-10-CM

## 2018-10-27 NOTE — Progress Notes (Signed)
Subjective:    Patient ID: Todd Riley, male    DOB: 09-May-1928, 85 y.o.   MRN: [id number]  HPI Patient was using a leaf blower today.  He accidentally stuck his left thumb into the spinning turbine of the leaf blower and suffered a severe abrasion to the finger pad of the left thumb.  The patient essentially removed all of the epidermis and dermis from the finger pad of the left thumb from the IP joint to the tip of the thumb.  However there is very little bleeding.  He is still able to flex and extend the IP joint without difficulty.  However there are no skin edges with which to approximate. Past Medical History:  Diagnosis Date  . GERD (gastroesophageal reflux disease)   . Hyperlipidemia   . Hypertension   . Prostate cancer [facility])    Past Surgical History:  Procedure Laterality Date  . CARDIOVASCULAR STRESS TEST  06/07/2006   ef 56%, no ischemia  . PROSTATE SURGERY    . ROTATOR CUFF REPAIR    . SHOULDER SURGERY     Current Outpatient Medications on File Prior to Visit  Medication Sig Dispense Refill  . aspirin 81 MG tablet Take 81 mg by mouth daily.    . calcium carbonate (OS-CAL) 600 MG TABS Take 600 mg by mouth 2 (two) times daily with a meal.    . leuprolide (LUPRON) 30 MG injection Inject 30 mg into the muscle every 4 (four) months.    . Multiple Vitamin (MULTIVITAMIN) tablet Take 1 tablet by mouth daily.    . multivitamin-lutein (OCUVITE-LUTEIN) CAPS Take 1 capsule by mouth daily.    . Omega-3 Fatty Acids (FISH OIL) 1200 MG CAPS Take 1 capsule by mouth 2 (two) times daily.    Marland Kitchen omeprazole (PRILOSEC) 20 MG capsule Take 1 capsule (20 mg total) by mouth daily. 90 capsule 3  . hydrochlorothiazide (HYDRODIURIL) 25 MG tablet Take 1 tablet (25 mg total) by mouth daily. (Patient not taking: Reported on 10/27/2018) 90 tablet 3   No current facility-administered medications on file prior to visit.    Allergies  Allergen Reactions  . Sulfa Antibiotics    Social History    Socioeconomic History  . Marital status: Married    Spouse name: Not on file  . Number of children: Not on file  . Years of education: Not on file  . Highest education level: Not on file  Occupational History  . Not on file  Social Needs  . Financial resource strain: Not on file  . Food insecurity    Worry: Not on file    Inability: Not on file  . Transportation needs    Medical: Not on file    Non-medical: Not on file  Tobacco Use  . Smoking status: Former Smoker    Quit date: 06/16/1994    Years since quitting: 24.3  . Smokeless tobacco: Never Used  Substance and Sexual Activity  . Alcohol use: No  . Drug use: No  . Sexual activity: Not on file  Lifestyle  . Physical activity    Days per week: Not on file    Minutes per session: Not on file  . Stress: Not on file  Relationships  . Social Herbalist on phone: Not on file    Gets together: Not on file    Attends religious service: Not on file    Active member of club or organization: Not on file  Attends meetings of clubs or organizations: Not on file    Relationship status: Not on file  . Intimate partner violence    Fear of current or ex partner: Not on file    Emotionally abused: Not on file    Physically abused: Not on file    Forced sexual activity: Not on file  Other Topics Concern  . Not on file  Social History Narrative  . Not on file     Review of Systems  All other systems reviewed and are negative.      Objective:   Physical Exam  Constitutional: He appears well-developed and well-nourished. No distress.  Neck: Neck supple.  Cardiovascular: Normal rate, regular rhythm and normal heart sounds.  No murmur heard. Pulmonary/Chest: Effort normal and breath sounds normal. No respiratory distress. He has no wheezes. He has no rales.  Abdominal: Soft. Bowel sounds are normal.  Musculoskeletal:     Left hand: He exhibits tenderness and laceration. Normal sensation noted. Normal strength  noted. He exhibits no finger abduction and no thumb/finger opposition.  Skin: He is not diaphoretic.  Vitals reviewed.      Assessment & Plan:  Need for Tdap vaccination - Plan: Tdap vaccine greater than or equal to 7yo IM  Cut of finger - Plan: Tdap vaccine greater than or equal to 7yo IM  Given the nature of the dirty cut and the fact his last tetanus shot was 8 years ago, the patient received a tetanus vaccine today.  The thumb was anesthetized with 0.1% lidocaine using a digital block at the base of the thumb.  A tourniquet was applied to the thumb.  The wound was then rinsed out thoroughly using sterile saline.  The wound was then scrubbed thoroughly with Betadine and a scrub brush.  The wound bed was then covered with Neosporin, petroleum gauze, and then wrapped with Coban.  Plan to redress the wound tomorrow.  We will treat the patient has a severe burn using Silvadene and daily dressing changes until the wound heals by secondary intention.  Unfortunately, there was no skin edges with which to approximate.

## 2018-10-28 ENCOUNTER — Ambulatory Visit
Admission: RE | Admit: 2018-10-28 | Discharge: 2018-10-28 | Disposition: A | Discharge status: Home / Self Care | Payer: Medicare Other | Source: Ambulatory Visit | Attending: Urology | Admitting: Urology

## 2018-10-28 ENCOUNTER — Ambulatory Visit: Payer: Medicare Other | Admitting: Family Medicine

## 2018-10-28 ENCOUNTER — Ambulatory Visit (INDEPENDENT_AMBULATORY_CARE_PROVIDER_SITE_OTHER): Payer: Medicare Other | Admitting: Family Medicine

## 2018-10-28 ENCOUNTER — Encounter: Payer: Self-pay | Admitting: Family Medicine

## 2018-10-28 VITALS — BP 184/80 | HR 68 | Temp 97.9°F | Resp 14 | Ht 71.0 in | Wt 191.0 lb

## 2018-10-28 DIAGNOSIS — S61209A Unspecified open wound of unspecified finger without damage to nail, initial encounter: Secondary | ICD-10-CM

## 2018-10-28 DIAGNOSIS — S61002D Unspecified open wound of left thumb without damage to nail, subsequent encounter: Secondary | ICD-10-CM

## 2018-10-28 DIAGNOSIS — M8580 Other specified disorders of bone density and structure, unspecified site: Secondary | ICD-10-CM

## 2018-10-28 DIAGNOSIS — S61219A Laceration without foreign body of unspecified finger without damage to nail, initial encounter: Secondary | ICD-10-CM

## 2018-10-28 MED ORDER — SILVER SULFADIAZINE 1 % EX CREA: 1.0000 "application " | TOPICAL_CREAM | Freq: Every day | CUTANEOUS | 0 refills | Status: DC

## 2018-10-28 MED ORDER — HYDROCODONE-ACETAMINOPHEN 5-325 MG PO TABS: 1.0000 | ORAL_TABLET | Freq: Four times a day (QID) | ORAL | 0 refills | Status: AC | PRN

## 2018-10-28 NOTE — Progress Notes (Signed)
Subjective:    Patient ID: Todd Riley, male    DOB: 08/25/1928, 85 y.o.   MRN: [id number]  HPI  10/27/18 Patient was using a leaf blower today.  He accidentally stuck his left thumb into the spinning turbine of the leaf blower and suffered a severe abrasion to the finger pad of the left thumb.  The patient essentially removed all of the epidermis and dermis from the finger pad of the left thumb from the IP joint to the tip of the thumb.  However there is very little bleeding.  He is still able to flex and extend the IP joint without difficulty.  However there are no skin edges with which to approximate.  At that time, my plan was: Given the nature of the dirty cut and the fact his last tetanus shot was 8 years ago, the patient received a tetanus vaccine today.  The thumb was anesthetized with 0.1% lidocaine using a digital block at the base of the thumb.  A tourniquet was applied to the thumb.  The wound was then rinsed out thoroughly using sterile saline.  The wound was then scrubbed thoroughly with Betadine and a scrub brush.  The wound bed was then covered with Neosporin, petroleum gauze, and then wrapped with Coban.  Plan to redress the wound tomorrow.  We will treat the patient has a severe burn using Silvadene and daily dressing changes until the wound heals by secondary intention.  Unfortunately, there was no skin edges with which to approximate.  10/28/18   Dressing was removed today.  The picture above shows an oozing wound.  There is no residual skin to approximate.  The wound is roughly 2.5 cm apart and extends from the tip of his thumb down to above the interphalangeal joint.  This is going to have to heal through secondary intention.  The wound is throbbing at night.  He would like something he can take for pain at night.  Recently the patient discontinued his hydrochlorothiazide due to low blood pressure.  Since stopping his hydrochlorothiazide his blood pressure has been between 90  and 137/50-70.  He feels better after stopping the hydrochlorothiazide.  Previously he was having orthostatic hypotension Past Medical History:  Diagnosis Date  . GERD (gastroesophageal reflux disease)   . Hyperlipidemia   . Hypertension   . Prostate cancer [facility])    Past Surgical History:  Procedure Laterality Date  . CARDIOVASCULAR STRESS TEST  06/07/2006   ef 56%, no ischemia  . PROSTATE SURGERY    . ROTATOR CUFF REPAIR    . SHOULDER SURGERY     Current Outpatient Medications on File Prior to Visit  Medication Sig Dispense Refill  . aspirin 81 MG tablet Take 81 mg by mouth daily.    . calcium carbonate (OS-CAL) 600 MG TABS Take 600 mg by mouth 2 (two) times daily with a meal.    . hydrochlorothiazide (HYDRODIURIL) 25 MG tablet Take 1 tablet (25 mg total) by mouth daily. (Patient not taking: Reported on 10/27/2018) 90 tablet 3  . leuprolide (LUPRON) 30 MG injection Inject 30 mg into the muscle every 4 (four) months.    . Multiple Vitamin (MULTIVITAMIN) tablet Take 1 tablet by mouth daily.    . multivitamin-lutein (OCUVITE-LUTEIN) CAPS Take 1 capsule by mouth daily.    . Omega-3 Fatty Acids (FISH OIL) 1200 MG CAPS Take 1 capsule by mouth 2 (two) times daily.    Marland Kitchen omeprazole (PRILOSEC) 20 MG capsule Take 1 capsule (  20 mg total) by mouth daily. 90 capsule 3   No current facility-administered medications on file prior to visit.    Allergies  Allergen Reactions  . Sulfa Antibiotics    Social History   Socioeconomic History  . Marital status: Married    Spouse name: Not on file  . Number of children: Not on file  . Years of education: Not on file  . Highest education level: Not on file  Occupational History  . Not on file  Social Needs  . Financial resource strain: Not on file  . Food insecurity    Worry: Not on file    Inability: Not on file  . Transportation needs    Medical: Not on file    Non-medical: Not on file  Tobacco Use  . Smoking status: Former Smoker    Quit  date: 06/16/1994    Years since quitting: 24.3  . Smokeless tobacco: Never Used  Substance and Sexual Activity  . Alcohol use: No  . Drug use: No  . Sexual activity: Not on file  Lifestyle  . Physical activity    Days per week: Not on file    Minutes per session: Not on file  . Stress: Not on file  Relationships  . Social Herbalist on phone: Not on file    Gets together: Not on file    Attends religious service: Not on file    Active member of club or organization: Not on file    Attends meetings of clubs or organizations: Not on file    Relationship status: Not on file  . Intimate partner violence    Fear of current or ex partner: Not on file    Emotionally abused: Not on file    Physically abused: Not on file    Forced sexual activity: Not on file  Other Topics Concern  . Not on file  Social History Narrative  . Not on file     Review of Systems  All other systems reviewed and are negative.      Objective:   Physical Exam  Constitutional: He appears well-developed and well-nourished. No distress.  Neck: Neck supple.  Cardiovascular: Normal rate, regular rhythm and normal heart sounds.  No murmur heard. Pulmonary/Chest: Effort normal and breath sounds normal. No respiratory distress. He has no wheezes. He has no rales.  Abdominal: Soft. Bowel sounds are normal.  Musculoskeletal:     Left hand: He exhibits tenderness and laceration. Normal sensation noted. Normal strength noted. He exhibits no finger abduction and no thumb/finger opposition.  Skin: He is not diaphoretic.  Vitals reviewed.      Assessment & Plan:   Cut of finger  The wound was covered with Silvadene and then wrapped with nonadherent gauze.  It was then wrapped with Coban.  I have recommended that the patient perform this type of dressing change every day.  Recheck here next week.  Wound will close gradually through secondary intention over a period of 1 month I would estimate.  Gave  the patient supplies to perform dressing changes.  I am fine with him discontinuing hydrochlorothiazide.  His blood pressure at home is much better.  Blood pressure here today is elevated however he is extremely anxious about changing his dressing.

## 2018-11-01 ENCOUNTER — Other Ambulatory Visit: Payer: Self-pay

## 2018-11-01 ENCOUNTER — Ambulatory Visit (INDEPENDENT_AMBULATORY_CARE_PROVIDER_SITE_OTHER): Payer: Medicare Other | Admitting: Family Medicine

## 2018-11-01 ENCOUNTER — Ambulatory Visit: Payer: Medicare Other

## 2018-11-01 ENCOUNTER — Encounter: Payer: Self-pay | Admitting: Family Medicine

## 2018-11-01 VITALS — BP 126/60 | HR 70 | Temp 98.2°F | Resp 16 | Ht 71.0 in | Wt 191.0 lb

## 2018-11-01 DIAGNOSIS — S61002D Unspecified open wound of left thumb without damage to nail, subsequent encounter: Secondary | ICD-10-CM | POA: Diagnosis not present

## 2018-11-01 DIAGNOSIS — G8929 Other chronic pain: Secondary | ICD-10-CM

## 2018-11-01 DIAGNOSIS — M25562 Pain in left knee: Secondary | ICD-10-CM | POA: Diagnosis not present

## 2018-11-01 DIAGNOSIS — S61219A Laceration without foreign body of unspecified finger without damage to nail, initial encounter: Secondary | ICD-10-CM

## 2018-11-01 DIAGNOSIS — S61209A Unspecified open wound of unspecified finger without damage to nail, initial encounter: Secondary | ICD-10-CM

## 2018-11-01 NOTE — Progress Notes (Signed)
Subjective:    Patient ID: Todd Riley, male    DOB: Dec 16, 1928, 85 y.o.   MRN: [id number]  HPI  10/27/18 Patient was using a leaf blower today.  He accidentally stuck his left thumb into the spinning turbine of the leaf blower and suffered a severe abrasion to the finger pad of the left thumb.  The patient essentially removed all of the epidermis and dermis from the finger pad of the left thumb from the IP joint to the tip of the thumb.  However there is very little bleeding.  He is still able to flex and extend the IP joint without difficulty.  However there are no skin edges with which to approximate.  At that time, my plan was: Given the nature of the dirty cut and the fact his last tetanus shot was 8 years ago, the patient received a tetanus vaccine today.  The thumb was anesthetized with 0.1% lidocaine using a digital block at the base of the thumb.  A tourniquet was applied to the thumb.  The wound was then rinsed out thoroughly using sterile saline.  The wound was then scrubbed thoroughly with Betadine and a scrub brush.  The wound bed was then covered with Neosporin, petroleum gauze, and then wrapped with Coban.  Plan to redress the wound tomorrow.  We will treat the patient has a severe burn using Silvadene and daily dressing changes until the wound heals by secondary intention.  Unfortunately, there was no skin edges with which to approximate.  10/28/18   Dressing was removed today.  The picture above shows an oozing wound.  There is no residual skin to approximate.  The wound is roughly 2.5 cm apart and extends from the tip of his thumb down to above the interphalangeal joint.  This is going to have to heal through secondary intention.  The wound is throbbing at night.  He would like something he can take for pain at night.  Recently the patient discontinued his hydrochlorothiazide due to low blood pressure.  Since stopping his hydrochlorothiazide his blood pressure has been between 90  and 137/50-70.  He feels better after stopping the hydrochlorothiazide.  Previously he was having orthostatic hypotension.  At that time, my plan was: The wound was covered with Silvadene and then wrapped with nonadherent gauze.  It was then wrapped with Coban.  I have recommended that the patient perform this type of dressing change every day.  Recheck here next week.  Wound will close gradually through secondary intention over a period of 1 month I would estimate.  Gave the patient supplies to perform dressing changes.  I am fine with him discontinuing hydrochlorothiazide.  His blood pressure at home is much better.  Blood pressure here today is elevated however he is extremely anxious about changing his dressing.  11/01/18 All bleeding has stopped.  The wound now shows underlying subcutaneous tissue and muscle tissue but no active bleeding.  There is no evidence of secondary cellulitis.  There is no purulent drainage.  Patient does request a cortisone injection in his left knee.  He twisted his knee recently and has had pain over the lateral joint line ever since.  He has a small effusion today on exam and pain with ambulation. Past Medical History:  Diagnosis Date   GERD (gastroesophageal reflux disease)    Hyperlipidemia    Hypertension    Prostate cancer [facility])    Past Surgical History:  Procedure Laterality Date   CARDIOVASCULAR STRESS  TEST  06/07/2006   ef 56%, no ischemia   PROSTATE SURGERY     ROTATOR CUFF REPAIR     SHOULDER SURGERY     Current Outpatient Medications on File Prior to Visit  Medication Sig Dispense Refill   aspirin 81 MG tablet Take 81 mg by mouth daily.     calcium carbonate (OS-CAL) 600 MG TABS Take 600 mg by mouth 2 (two) times daily with a meal.     HYDROcodone-acetaminophen (NORCO) 5-325 MG tablet Take 1 tablet by mouth every 6 (six) hours as needed for moderate pain. 30 tablet 0   leuprolide (LUPRON) 30 MG injection Inject 30 mg into the muscle every  4 (four) months.     Multiple Vitamin (MULTIVITAMIN) tablet Take 1 tablet by mouth daily.     multivitamin-lutein (OCUVITE-LUTEIN) CAPS Take 1 capsule by mouth daily.     NUBEQA 300 MG TABS      Omega-3 Fatty Acids (FISH OIL) 1200 MG CAPS Take 1 capsule by mouth 2 (two) times daily.     omeprazole (PRILOSEC) 20 MG capsule Take 1 capsule (20 mg total) by mouth daily. 90 capsule 3   silver sulfADIAZINE (SILVADENE) 1 % cream Apply 1 application topically daily. 50 g 0   No current facility-administered medications on file prior to visit.    Allergies  Allergen Reactions   Sulfa Antibiotics    Social History   Socioeconomic History   Marital status: Married    Spouse name: Not on file   Number of children: Not on file   Years of education: Not on file   Highest education level: Not on file  Occupational History   Not on file  Social Needs   Financial resource strain: Not on file   Food insecurity    Worry: Not on file    Inability: Not on file   Transportation needs    Medical: Not on file    Non-medical: Not on file  Tobacco Use   Smoking status: Former Smoker    Quit date: 06/16/1994    Years since quitting: 24.3   Smokeless tobacco: Never Used  Substance and Sexual Activity   Alcohol use: No   Drug use: No   Sexual activity: Not on file  Lifestyle   Physical activity    Days per week: Not on file    Minutes per session: Not on file   Stress: Not on file  Relationships   Social connections    Talks on phone: Not on file    Gets together: Not on file    Attends religious service: Not on file    Active member of club or organization: Not on file    Attends meetings of clubs or organizations: Not on file    Relationship status: Not on file   Intimate partner violence    Fear of current or ex partner: Not on file    Emotionally abused: Not on file    Physically abused: Not on file    Forced sexual activity: Not on file  Other Topics Concern     Not on file  Social History Narrative   Not on file     Review of Systems  All other systems reviewed and are negative.      Objective:   Physical Exam  Constitutional: He appears well-developed and well-nourished. No distress.  Neck: Neck supple.  Cardiovascular: Normal rate, regular rhythm and normal heart sounds.  No murmur heard. Pulmonary/Chest: Effort normal and  breath sounds normal. No respiratory distress. He has no wheezes. He has no rales.  Abdominal: Soft. Bowel sounds are normal.  Musculoskeletal:     Left knee: He exhibits decreased range of motion and effusion. Tenderness found. Medial joint line and lateral joint line tenderness noted.     Left hand: He exhibits tenderness and laceration. Normal sensation noted. Normal strength noted. He exhibits no finger abduction and no thumb/finger opposition.  Skin: He is not diaphoretic.  Vitals reviewed.      Assessment & Plan:   Cut of finger  Chronic pain of left knee  I anticipate that it will take probably 4 to 6 weeks for this wound to close the secondary intention.  Continue daily dressing changes applying Neosporin or Silvadene to the wound, covered with nonadherent gauze and then wrapping with coban.  Recheck if complications arise or if not healing well.  Using sterile technique, I injected the left knee with 2 cc lidocaine, 2 cc of Marcaine, and 2 cc of 40 mg/mL Kenalog.  The patient tolerated the procedure well without complication.

## 2018-11-11 ENCOUNTER — Ambulatory Visit: Payer: Medicare Other

## 2018-11-16 ENCOUNTER — Ambulatory Visit (INDEPENDENT_AMBULATORY_CARE_PROVIDER_SITE_OTHER): Payer: Medicare Other

## 2018-11-16 ENCOUNTER — Other Ambulatory Visit: Payer: Self-pay

## 2018-11-16 DIAGNOSIS — Z23 Encounter for immunization: Secondary | ICD-10-CM | POA: Diagnosis not present

## 2018-11-17 ENCOUNTER — Ambulatory Visit (INDEPENDENT_AMBULATORY_CARE_PROVIDER_SITE_OTHER): Payer: Medicare Other | Admitting: Family Medicine

## 2018-11-17 ENCOUNTER — Ambulatory Visit
Admission: RE | Admit: 2018-11-17 | Discharge: 2018-11-17 | Disposition: A | Discharge status: Home / Self Care | Payer: Medicare Other | Source: Ambulatory Visit | Attending: Family Medicine | Admitting: Family Medicine

## 2018-11-17 ENCOUNTER — Encounter: Payer: Self-pay | Admitting: Family Medicine

## 2018-11-17 VITALS — BP 150/68 | HR 78 | Temp 97.6°F | Resp 16 | Ht 71.0 in | Wt 184.0 lb

## 2018-11-17 DIAGNOSIS — R197 Diarrhea, unspecified: Secondary | ICD-10-CM | POA: Diagnosis not present

## 2018-11-17 MED ORDER — DIPHENOXYLATE-ATROPINE 2.5-0.025 MG PO TABS: 1.0000 | ORAL_TABLET | Freq: Four times a day (QID) | ORAL | 0 refills | Status: DC | PRN

## 2018-11-17 MED ORDER — HYDROCORTISONE 2.5 % EX CREA: TOPICAL_CREAM | Freq: Two times a day (BID) | CUTANEOUS | 0 refills | Status: DC

## 2018-11-17 NOTE — Progress Notes (Signed)
Subjective:    Patient ID: Todd Riley, male    DOB: 20-Jan-1929, 85 y.o.   MRN: [id number]  HPI  Patient states that approximately 1 week ago he was suffering with constipation.  He had gone more than 6 days without a bowel movement.  He then took MiraLAX as well as some Dulcolax.  Following that he had several days of copious stool production.  Since that time he is having diarrhea.  He will occasionally have a normal bowel movement.  However he will also have episodes where he will have the urge to defecate and it will be pure brown water.  Often he will not have any sensation that he has to use the bathroom and he will leak brown or clear water out of his rectum.  He is wiping so much that he is irritated his hemorrhoids.  He is also occasionally having some nausea and foul-smelling vomitus that is extremely salty and taste.  He denies any fever.  He denies any abdominal pain.  He denies any melena or hematochezia or hematemesis or bilious emesis. Past Medical History:  Diagnosis Date  . GERD (gastroesophageal reflux disease)   . Hyperlipidemia   . Hypertension   . Prostate cancer [facility])    Past Surgical History:  Procedure Laterality Date  . CARDIOVASCULAR STRESS TEST  06/07/2006   ef 56%, no ischemia  . PROSTATE SURGERY    . ROTATOR CUFF REPAIR    . SHOULDER SURGERY     Current Outpatient Medications on File Prior to Visit  Medication Sig Dispense Refill  . aspirin 81 MG tablet Take 81 mg by mouth daily.    . calcium carbonate (OS-CAL) 600 MG TABS Take 600 mg by mouth 2 (two) times daily with a meal.    . HYDROcodone-acetaminophen (NORCO) 5-325 MG tablet Take 1 tablet by mouth every 6 (six) hours as needed for moderate pain. 30 tablet 0  . leuprolide (LUPRON) 30 MG injection Inject 30 mg into the muscle every 4 (four) months.    . Multiple Vitamin (MULTIVITAMIN) tablet Take 1 tablet by mouth daily.    . multivitamin-lutein (OCUVITE-LUTEIN) CAPS Take 1 capsule by mouth daily.     . NUBEQA 300 MG TABS     . Omega-3 Fatty Acids (FISH OIL) 1200 MG CAPS Take 1 capsule by mouth 2 (two) times daily.    Marland Kitchen omeprazole (PRILOSEC) 20 MG capsule Take 1 capsule (20 mg total) by mouth daily. 90 capsule 3  . silver sulfADIAZINE (SILVADENE) 1 % cream Apply 1 application topically daily. 50 g 0   No current facility-administered medications on file prior to visit.    Allergies  Allergen Reactions  . Sulfa Antibiotics    Social History   Socioeconomic History  . Marital status: Married    Spouse name: Not on file  . Number of children: Not on file  . Years of education: Not on file  . Highest education level: Not on file  Occupational History  . Not on file  Social Needs  . Financial resource strain: Not on file  . Food insecurity    Worry: Not on file    Inability: Not on file  . Transportation needs    Medical: Not on file    Non-medical: Not on file  Tobacco Use  . Smoking status: Former Smoker    Quit date: 06/16/1994    Years since quitting: 24.4  . Smokeless tobacco: Never Used  Substance and Sexual Activity  .  Alcohol use: No  . Drug use: No  . Sexual activity: Not on file  Lifestyle  . Physical activity    Days per week: Not on file    Minutes per session: Not on file  . Stress: Not on file  Relationships  . Social Herbalist on phone: Not on file    Gets together: Not on file    Attends religious service: Not on file    Active member of club or organization: Not on file    Attends meetings of clubs or organizations: Not on file    Relationship status: Not on file  . Intimate partner violence    Fear of current or ex partner: Not on file    Emotionally abused: Not on file    Physically abused: Not on file    Forced sexual activity: Not on file  Other Topics Concern  . Not on file  Social History Narrative  . Not on file     Review of Systems  Gastrointestinal: Positive for diarrhea.  All other systems reviewed and are negative.       Objective:   Physical Exam  Constitutional: He appears well-developed and well-nourished. No distress.  HENT:  Right Ear: Tympanic membrane and ear canal normal.  Left Ear: Tympanic membrane and ear canal normal.  Nose: No mucosal edema or rhinorrhea. Right sinus exhibits no maxillary sinus tenderness and no frontal sinus tenderness. Left sinus exhibits no maxillary sinus tenderness and no frontal sinus tenderness.  Neck: Neck supple.  Cardiovascular: Normal rate, regular rhythm and normal heart sounds.  No murmur heard. Pulmonary/Chest: Effort normal. No respiratory distress. He has no wheezes. He has no rhonchi. He has no rales.  Abdominal: Soft. Bowel sounds are normal. He exhibits no distension and no mass. There is no abdominal tenderness. There is no rebound and no guarding.  Skin: He is not diaphoretic.  Vitals reviewed.         Assessment & Plan:   Diarrhea, unspecified type - Plan: diphenoxylate-atropine (LOMOTIL) 2.5-0.025 MG tablet, DG Abd 2 Views  Obtain x-ray of the abdomen to rule out impaction or blockage or obstruction.  Likely overstimulation due to Dulcolax which I will treat with Lomotil 1 tablet every 6 hours as needed for loose stool.  I also recommended the patient begin fiber such as Metamucil as a bulking agent.  He can use hydrocortisone cream 2.5% applied rectally 2-3 times a day for the hemorrhoid.

## 2019-03-27 DIAGNOSIS — L723 Sebaceous cyst: Secondary | ICD-10-CM | POA: Diagnosis not present

## 2019-03-27 DIAGNOSIS — L570 Actinic keratosis: Secondary | ICD-10-CM | POA: Diagnosis not present

## 2019-04-05 DIAGNOSIS — H6123 Impacted cerumen, bilateral: Secondary | ICD-10-CM | POA: Diagnosis not present

## 2019-04-20 ENCOUNTER — Encounter: Payer: Self-pay | Admitting: Family Medicine

## 2019-04-20 ENCOUNTER — Ambulatory Visit (INDEPENDENT_AMBULATORY_CARE_PROVIDER_SITE_OTHER): Payer: Medicare Other | Admitting: Family Medicine

## 2019-04-20 ENCOUNTER — Other Ambulatory Visit: Payer: Self-pay

## 2019-04-20 VITALS — BP 156/80 | HR 78 | Temp 96.8°F | Resp 16 | Ht 71.0 in | Wt 184.0 lb

## 2019-04-20 DIAGNOSIS — M25562 Pain in left knee: Secondary | ICD-10-CM | POA: Diagnosis not present

## 2019-04-20 NOTE — Progress Notes (Signed)
Subjective:    Patient ID: Todd Riley, male    DOB: [birth year], 86 y.o.   MRN: [id number]  The patient is a very pleasant 86 year old Caucasian male who presents today with left knee pain.  He has osteoarthritis in the left knee.  He reports an effusion.  He reports pain and stiffness with flexion and extension.  He reports pain with ambulation.  He is requesting a cortisone injection today. Past Medical History:  Diagnosis Date  . GERD (gastroesophageal reflux disease)   . Hyperlipidemia   . Hypertension   . Prostate cancer [facility])    Past Surgical History:  Procedure Laterality Date  . CARDIOVASCULAR STRESS TEST  06/07/2006   ef 56%, no ischemia  . PROSTATE SURGERY    . ROTATOR CUFF REPAIR    . SHOULDER SURGERY     Current Outpatient Medications on File Prior to Visit  Medication Sig Dispense Refill  . aspirin 81 MG tablet Take 81 mg by mouth daily.    . calcium carbonate (OS-CAL) 600 MG TABS Take 600 mg by mouth 2 (two) times daily with a meal.    . diphenoxylate-atropine (LOMOTIL) 2.5-0.025 MG tablet Take 1 tablet by mouth 4 (four) times daily as needed for diarrhea or loose stools. 30 tablet 0  . HYDROcodone-acetaminophen (NORCO) 5-325 MG tablet Take 1 tablet by mouth every 6 (six) hours as needed for moderate pain. 30 tablet 0  . hydrocortisone 2.5 % cream Apply topically 2 (two) times daily. 30 g 0  . leuprolide (LUPRON) 30 MG injection Inject 30 mg into the muscle every 4 (four) months.    . Multiple Vitamin (MULTIVITAMIN) tablet Take 1 tablet by mouth daily.    . multivitamin-lutein (OCUVITE-LUTEIN) CAPS Take 1 capsule by mouth daily.    . NUBEQA 300 MG TABS     . Omega-3 Fatty Acids (FISH OIL) 1200 MG CAPS Take 1 capsule by mouth 2 (two) times daily.    Marland Kitchen omeprazole (PRILOSEC) 20 MG capsule Take 1 capsule (20 mg total) by mouth daily. 90 capsule 3  . silver sulfADIAZINE (SILVADENE) 1 % cream Apply 1 application topically daily. 50 g 0   No current  facility-administered medications on file prior to visit.   Allergies  Allergen Reactions  . Sulfa Antibiotics    Social History   Socioeconomic History  . Marital status: Married    Spouse name: Not on file  . Number of children: Not on file  . Years of education: Not on file  . Highest education level: Not on file  Occupational History  . Not on file  Tobacco Use  . Smoking status: Former Smoker    Quit date: 06/16/1994    Years since quitting: 24.8  . Smokeless tobacco: Never Used  Substance and Sexual Activity  . Alcohol use: No  . Drug use: No  . Sexual activity: Not on file  Other Topics Concern  . Not on file  Social History Narrative  . Not on file   Social Determinants of Health   Financial Resource Strain:   . Difficulty of Paying Living Expenses: Not on file  Food Insecurity:   . Worried About Charity fundraiser in the Last Year: Not on file  . Ran Out of Food in the Last Year: Not on file  Transportation Needs:   . Lack of Transportation (Medical): Not on file  . Lack of Transportation (Non-Medical): Not on file  Physical Activity:   . Days of Exercise  per Week: Not on file  . Minutes of Exercise per Session: Not on file  Stress:   . Feeling of Stress : Not on file  Social Connections:   . Frequency of Communication with Friends and Family: Not on file  . Frequency of Social Gatherings with Friends and Family: Not on file  . Attends Religious Services: Not on file  . Active Member of Clubs or Organizations: Not on file  . Attends Archivist Meetings: Not on file  . Marital Status: Not on file  Intimate Partner Violence:   . Fear of Current or Ex-Partner: Not on file  . Emotionally Abused: Not on file  . Physically Abused: Not on file  . Sexually Abused: Not on file     Review of Systems  All other systems reviewed and are negative.      Objective:   Physical Exam  Constitutional: He appears well-developed and well-nourished. No  distress.  HENT:  Right Ear: Tympanic membrane and ear canal normal.  Left Ear: Tympanic membrane and ear canal normal.  Nose: No mucosal edema or rhinorrhea. Right sinus exhibits no maxillary sinus tenderness and no frontal sinus tenderness. Left sinus exhibits no maxillary sinus tenderness and no frontal sinus tenderness.  Cardiovascular: Normal rate, regular rhythm and normal heart sounds.  No murmur heard. Pulmonary/Chest: Effort normal and breath sounds normal. No respiratory distress. He has no wheezes. He has no rhonchi. He has no rales.  Abdominal: Soft. Bowel sounds are normal.  Musculoskeletal:     Cervical back: Neck supple.     Left knee: Swelling present. No effusion. Decreased range of motion. Tenderness present over the medial joint line and lateral joint line.  Skin: He is not diaphoretic.  Vitals reviewed.         Assessment & Plan:   Acute pain of left knee  I believe the patient's knee pain is due to osteoarthritis.  Using sterile technique, I injected the left knee with 2 cc lidocaine, 2 cc of Marcaine, and 2 cc of 40 mg/mL Kenalog.  Patient tolerated the procedure well.  Upon withdrawing the needle, there was venous bleeding from a varicose vein that must of been punctured by the needle as the joint was injected.  A pressure dressing was applied using a 2 x 2 piece of gauze and Covan.  Recommended a pressure dressing be left in place until later this afternoon.

## 2019-05-04 DIAGNOSIS — C61 Malignant neoplasm of prostate: Secondary | ICD-10-CM | POA: Diagnosis not present

## 2019-05-11 DIAGNOSIS — N281 Cyst of kidney, acquired: Secondary | ICD-10-CM | POA: Diagnosis not present

## 2019-05-11 DIAGNOSIS — M8580 Other specified disorders of bone density and structure, unspecified site: Secondary | ICD-10-CM | POA: Diagnosis not present

## 2019-05-11 DIAGNOSIS — C61 Malignant neoplasm of prostate: Secondary | ICD-10-CM | POA: Diagnosis not present

## 2019-06-02 DIAGNOSIS — M1712 Unilateral primary osteoarthritis, left knee: Secondary | ICD-10-CM | POA: Diagnosis not present

## 2019-06-02 DIAGNOSIS — M25562 Pain in left knee: Secondary | ICD-10-CM | POA: Diagnosis not present

## 2019-06-21 DIAGNOSIS — M1712 Unilateral primary osteoarthritis, left knee: Secondary | ICD-10-CM | POA: Diagnosis not present

## 2019-06-21 DIAGNOSIS — C61 Malignant neoplasm of prostate: Secondary | ICD-10-CM | POA: Diagnosis not present

## 2019-06-21 DIAGNOSIS — M810 Age-related osteoporosis without current pathological fracture: Secondary | ICD-10-CM | POA: Diagnosis not present

## 2019-06-21 DIAGNOSIS — Z7689 Persons encountering health services in other specified circumstances: Secondary | ICD-10-CM | POA: Diagnosis not present

## 2019-08-09 DIAGNOSIS — C61 Malignant neoplasm of prostate: Secondary | ICD-10-CM | POA: Diagnosis not present

## 2019-08-09 DIAGNOSIS — L709 Acne, unspecified: Secondary | ICD-10-CM | POA: Diagnosis not present

## 2019-08-09 DIAGNOSIS — M8580 Other specified disorders of bone density and structure, unspecified site: Secondary | ICD-10-CM | POA: Diagnosis not present

## 2019-08-09 DIAGNOSIS — L403 Pustulosis palmaris et plantaris: Secondary | ICD-10-CM | POA: Diagnosis not present

## 2019-08-09 DIAGNOSIS — M659 Synovitis and tenosynovitis, unspecified: Secondary | ICD-10-CM | POA: Diagnosis not present

## 2019-08-09 DIAGNOSIS — M869 Osteomyelitis, unspecified: Secondary | ICD-10-CM | POA: Diagnosis not present

## 2019-08-09 DIAGNOSIS — N281 Cyst of kidney, acquired: Secondary | ICD-10-CM | POA: Diagnosis not present

## 2019-08-15 DIAGNOSIS — M21062 Valgus deformity, not elsewhere classified, left knee: Secondary | ICD-10-CM | POA: Diagnosis not present

## 2019-08-17 DIAGNOSIS — M8580 Other specified disorders of bone density and structure, unspecified site: Secondary | ICD-10-CM | POA: Diagnosis not present

## 2019-08-17 DIAGNOSIS — Z5111 Encounter for antineoplastic chemotherapy: Secondary | ICD-10-CM | POA: Diagnosis not present

## 2019-08-17 DIAGNOSIS — N3000 Acute cystitis without hematuria: Secondary | ICD-10-CM | POA: Diagnosis not present

## 2019-08-17 DIAGNOSIS — C61 Malignant neoplasm of prostate: Secondary | ICD-10-CM | POA: Diagnosis not present

## 2019-08-23 DIAGNOSIS — R262 Difficulty in walking, not elsewhere classified: Secondary | ICD-10-CM | POA: Diagnosis not present

## 2019-08-23 DIAGNOSIS — Z471 Aftercare following joint replacement surgery: Secondary | ICD-10-CM | POA: Diagnosis not present

## 2019-08-23 DIAGNOSIS — Z96652 Presence of left artificial knee joint: Secondary | ICD-10-CM | POA: Diagnosis not present

## 2019-08-23 DIAGNOSIS — G8918 Other acute postprocedural pain: Secondary | ICD-10-CM | POA: Diagnosis not present

## 2019-08-23 DIAGNOSIS — M25562 Pain in left knee: Secondary | ICD-10-CM | POA: Diagnosis not present

## 2019-08-23 DIAGNOSIS — M1712 Unilateral primary osteoarthritis, left knee: Secondary | ICD-10-CM | POA: Diagnosis not present

## 2019-08-23 DIAGNOSIS — M6281 Muscle weakness (generalized): Secondary | ICD-10-CM | POA: Diagnosis not present

## 2019-08-24 DIAGNOSIS — R262 Difficulty in walking, not elsewhere classified: Secondary | ICD-10-CM | POA: Diagnosis not present

## 2019-08-24 DIAGNOSIS — M25562 Pain in left knee: Secondary | ICD-10-CM | POA: Diagnosis not present

## 2019-08-24 DIAGNOSIS — M1712 Unilateral primary osteoarthritis, left knee: Secondary | ICD-10-CM | POA: Diagnosis not present

## 2019-08-24 DIAGNOSIS — M6281 Muscle weakness (generalized): Secondary | ICD-10-CM | POA: Diagnosis not present

## 2019-08-28 DIAGNOSIS — R262 Difficulty in walking, not elsewhere classified: Secondary | ICD-10-CM | POA: Diagnosis not present

## 2019-08-28 DIAGNOSIS — Z96652 Presence of left artificial knee joint: Secondary | ICD-10-CM | POA: Diagnosis not present

## 2019-08-28 DIAGNOSIS — R600 Localized edema: Secondary | ICD-10-CM | POA: Diagnosis not present

## 2019-08-28 DIAGNOSIS — M6281 Muscle weakness (generalized): Secondary | ICD-10-CM | POA: Diagnosis not present

## 2019-08-28 DIAGNOSIS — M25562 Pain in left knee: Secondary | ICD-10-CM | POA: Diagnosis not present

## 2019-08-28 DIAGNOSIS — M25662 Stiffness of left knee, not elsewhere classified: Secondary | ICD-10-CM | POA: Diagnosis not present

## 2019-08-31 DIAGNOSIS — Z96652 Presence of left artificial knee joint: Secondary | ICD-10-CM | POA: Diagnosis not present

## 2019-08-31 DIAGNOSIS — M6281 Muscle weakness (generalized): Secondary | ICD-10-CM | POA: Diagnosis not present

## 2019-08-31 DIAGNOSIS — R262 Difficulty in walking, not elsewhere classified: Secondary | ICD-10-CM | POA: Diagnosis not present

## 2019-08-31 DIAGNOSIS — M25562 Pain in left knee: Secondary | ICD-10-CM | POA: Diagnosis not present

## 2019-08-31 DIAGNOSIS — M25662 Stiffness of left knee, not elsewhere classified: Secondary | ICD-10-CM | POA: Diagnosis not present

## 2019-08-31 DIAGNOSIS — R600 Localized edema: Secondary | ICD-10-CM | POA: Diagnosis not present

## 2019-09-04 DIAGNOSIS — M25562 Pain in left knee: Secondary | ICD-10-CM | POA: Diagnosis not present

## 2019-09-04 DIAGNOSIS — M25662 Stiffness of left knee, not elsewhere classified: Secondary | ICD-10-CM | POA: Diagnosis not present

## 2019-09-04 DIAGNOSIS — R262 Difficulty in walking, not elsewhere classified: Secondary | ICD-10-CM | POA: Diagnosis not present

## 2019-09-04 DIAGNOSIS — R600 Localized edema: Secondary | ICD-10-CM | POA: Diagnosis not present

## 2019-09-04 DIAGNOSIS — Z96652 Presence of left artificial knee joint: Secondary | ICD-10-CM | POA: Diagnosis not present

## 2019-09-04 DIAGNOSIS — M6281 Muscle weakness (generalized): Secondary | ICD-10-CM | POA: Diagnosis not present

## 2019-09-07 DIAGNOSIS — R262 Difficulty in walking, not elsewhere classified: Secondary | ICD-10-CM | POA: Diagnosis not present

## 2019-09-07 DIAGNOSIS — R600 Localized edema: Secondary | ICD-10-CM | POA: Diagnosis not present

## 2019-09-07 DIAGNOSIS — Z96652 Presence of left artificial knee joint: Secondary | ICD-10-CM | POA: Diagnosis not present

## 2019-09-07 DIAGNOSIS — M25662 Stiffness of left knee, not elsewhere classified: Secondary | ICD-10-CM | POA: Diagnosis not present

## 2019-09-07 DIAGNOSIS — M6281 Muscle weakness (generalized): Secondary | ICD-10-CM | POA: Diagnosis not present

## 2019-09-07 DIAGNOSIS — M25562 Pain in left knee: Secondary | ICD-10-CM | POA: Diagnosis not present

## 2019-09-08 DIAGNOSIS — Z96659 Presence of unspecified artificial knee joint: Secondary | ICD-10-CM | POA: Diagnosis not present

## 2019-09-08 DIAGNOSIS — Z4889 Encounter for other specified surgical aftercare: Secondary | ICD-10-CM | POA: Diagnosis not present

## 2019-09-08 DIAGNOSIS — Z96652 Presence of left artificial knee joint: Secondary | ICD-10-CM | POA: Diagnosis not present

## 2019-09-11 DIAGNOSIS — M6281 Muscle weakness (generalized): Secondary | ICD-10-CM | POA: Diagnosis not present

## 2019-09-11 DIAGNOSIS — R600 Localized edema: Secondary | ICD-10-CM | POA: Diagnosis not present

## 2019-09-11 DIAGNOSIS — Z96652 Presence of left artificial knee joint: Secondary | ICD-10-CM | POA: Diagnosis not present

## 2019-09-11 DIAGNOSIS — R262 Difficulty in walking, not elsewhere classified: Secondary | ICD-10-CM | POA: Diagnosis not present

## 2019-09-11 DIAGNOSIS — M25562 Pain in left knee: Secondary | ICD-10-CM | POA: Diagnosis not present

## 2019-09-11 DIAGNOSIS — M25662 Stiffness of left knee, not elsewhere classified: Secondary | ICD-10-CM | POA: Diagnosis not present

## 2019-09-15 DIAGNOSIS — R600 Localized edema: Secondary | ICD-10-CM | POA: Diagnosis not present

## 2019-09-15 DIAGNOSIS — R262 Difficulty in walking, not elsewhere classified: Secondary | ICD-10-CM | POA: Diagnosis not present

## 2019-09-15 DIAGNOSIS — Z96652 Presence of left artificial knee joint: Secondary | ICD-10-CM | POA: Diagnosis not present

## 2019-09-15 DIAGNOSIS — M25562 Pain in left knee: Secondary | ICD-10-CM | POA: Diagnosis not present

## 2019-09-15 DIAGNOSIS — M6281 Muscle weakness (generalized): Secondary | ICD-10-CM | POA: Diagnosis not present

## 2019-09-15 DIAGNOSIS — M25662 Stiffness of left knee, not elsewhere classified: Secondary | ICD-10-CM | POA: Diagnosis not present

## 2019-09-18 DIAGNOSIS — M25662 Stiffness of left knee, not elsewhere classified: Secondary | ICD-10-CM | POA: Diagnosis not present

## 2019-09-18 DIAGNOSIS — R600 Localized edema: Secondary | ICD-10-CM | POA: Diagnosis not present

## 2019-09-18 DIAGNOSIS — M25562 Pain in left knee: Secondary | ICD-10-CM | POA: Diagnosis not present

## 2019-09-18 DIAGNOSIS — M6281 Muscle weakness (generalized): Secondary | ICD-10-CM | POA: Diagnosis not present

## 2019-09-18 DIAGNOSIS — Z96652 Presence of left artificial knee joint: Secondary | ICD-10-CM | POA: Diagnosis not present

## 2019-09-18 DIAGNOSIS — R262 Difficulty in walking, not elsewhere classified: Secondary | ICD-10-CM | POA: Diagnosis not present

## 2019-09-21 DIAGNOSIS — R262 Difficulty in walking, not elsewhere classified: Secondary | ICD-10-CM | POA: Diagnosis not present

## 2019-09-21 DIAGNOSIS — M25562 Pain in left knee: Secondary | ICD-10-CM | POA: Diagnosis not present

## 2019-09-21 DIAGNOSIS — Z96652 Presence of left artificial knee joint: Secondary | ICD-10-CM | POA: Diagnosis not present

## 2019-09-21 DIAGNOSIS — M25662 Stiffness of left knee, not elsewhere classified: Secondary | ICD-10-CM | POA: Diagnosis not present

## 2019-09-21 DIAGNOSIS — R600 Localized edema: Secondary | ICD-10-CM | POA: Diagnosis not present

## 2019-09-21 DIAGNOSIS — M6281 Muscle weakness (generalized): Secondary | ICD-10-CM | POA: Diagnosis not present

## 2019-09-26 DIAGNOSIS — M25662 Stiffness of left knee, not elsewhere classified: Secondary | ICD-10-CM | POA: Diagnosis not present

## 2019-09-26 DIAGNOSIS — M6281 Muscle weakness (generalized): Secondary | ICD-10-CM | POA: Diagnosis not present

## 2019-09-26 DIAGNOSIS — M25562 Pain in left knee: Secondary | ICD-10-CM | POA: Diagnosis not present

## 2019-09-26 DIAGNOSIS — R600 Localized edema: Secondary | ICD-10-CM | POA: Diagnosis not present

## 2019-09-26 DIAGNOSIS — R262 Difficulty in walking, not elsewhere classified: Secondary | ICD-10-CM | POA: Diagnosis not present

## 2019-09-26 DIAGNOSIS — Z96652 Presence of left artificial knee joint: Secondary | ICD-10-CM | POA: Diagnosis not present

## 2019-09-28 DIAGNOSIS — M6281 Muscle weakness (generalized): Secondary | ICD-10-CM | POA: Diagnosis not present

## 2019-09-28 DIAGNOSIS — Z96652 Presence of left artificial knee joint: Secondary | ICD-10-CM | POA: Diagnosis not present

## 2019-09-28 DIAGNOSIS — R262 Difficulty in walking, not elsewhere classified: Secondary | ICD-10-CM | POA: Diagnosis not present

## 2019-09-28 DIAGNOSIS — R600 Localized edema: Secondary | ICD-10-CM | POA: Diagnosis not present

## 2019-09-28 DIAGNOSIS — M25562 Pain in left knee: Secondary | ICD-10-CM | POA: Diagnosis not present

## 2019-09-28 DIAGNOSIS — M25662 Stiffness of left knee, not elsewhere classified: Secondary | ICD-10-CM | POA: Diagnosis not present

## 2019-10-02 DIAGNOSIS — M6281 Muscle weakness (generalized): Secondary | ICD-10-CM | POA: Diagnosis not present

## 2019-10-02 DIAGNOSIS — Z96652 Presence of left artificial knee joint: Secondary | ICD-10-CM | POA: Diagnosis not present

## 2019-10-02 DIAGNOSIS — R262 Difficulty in walking, not elsewhere classified: Secondary | ICD-10-CM | POA: Diagnosis not present

## 2019-10-02 DIAGNOSIS — R600 Localized edema: Secondary | ICD-10-CM | POA: Diagnosis not present

## 2019-10-02 DIAGNOSIS — M25562 Pain in left knee: Secondary | ICD-10-CM | POA: Diagnosis not present

## 2019-10-02 DIAGNOSIS — M25662 Stiffness of left knee, not elsewhere classified: Secondary | ICD-10-CM | POA: Diagnosis not present

## 2019-10-09 DIAGNOSIS — R262 Difficulty in walking, not elsewhere classified: Secondary | ICD-10-CM | POA: Diagnosis not present

## 2019-10-09 DIAGNOSIS — Z96652 Presence of left artificial knee joint: Secondary | ICD-10-CM | POA: Diagnosis not present

## 2019-10-09 DIAGNOSIS — M25662 Stiffness of left knee, not elsewhere classified: Secondary | ICD-10-CM | POA: Diagnosis not present

## 2019-10-09 DIAGNOSIS — M25562 Pain in left knee: Secondary | ICD-10-CM | POA: Diagnosis not present

## 2019-10-09 DIAGNOSIS — R600 Localized edema: Secondary | ICD-10-CM | POA: Diagnosis not present

## 2019-10-09 DIAGNOSIS — M6281 Muscle weakness (generalized): Secondary | ICD-10-CM | POA: Diagnosis not present

## 2019-10-13 DIAGNOSIS — M25662 Stiffness of left knee, not elsewhere classified: Secondary | ICD-10-CM | POA: Diagnosis not present

## 2019-10-13 DIAGNOSIS — M25562 Pain in left knee: Secondary | ICD-10-CM | POA: Diagnosis not present

## 2019-10-13 DIAGNOSIS — R600 Localized edema: Secondary | ICD-10-CM | POA: Diagnosis not present

## 2019-10-13 DIAGNOSIS — Z96652 Presence of left artificial knee joint: Secondary | ICD-10-CM | POA: Diagnosis not present

## 2019-10-13 DIAGNOSIS — M6281 Muscle weakness (generalized): Secondary | ICD-10-CM | POA: Diagnosis not present

## 2019-10-13 DIAGNOSIS — R262 Difficulty in walking, not elsewhere classified: Secondary | ICD-10-CM | POA: Diagnosis not present

## 2019-10-16 DIAGNOSIS — M6281 Muscle weakness (generalized): Secondary | ICD-10-CM | POA: Diagnosis not present

## 2019-10-16 DIAGNOSIS — R262 Difficulty in walking, not elsewhere classified: Secondary | ICD-10-CM | POA: Diagnosis not present

## 2019-10-16 DIAGNOSIS — M25662 Stiffness of left knee, not elsewhere classified: Secondary | ICD-10-CM | POA: Diagnosis not present

## 2019-10-16 DIAGNOSIS — R600 Localized edema: Secondary | ICD-10-CM | POA: Diagnosis not present

## 2019-10-16 DIAGNOSIS — M25562 Pain in left knee: Secondary | ICD-10-CM | POA: Diagnosis not present

## 2019-10-16 DIAGNOSIS — Z96652 Presence of left artificial knee joint: Secondary | ICD-10-CM | POA: Diagnosis not present

## 2019-10-24 DIAGNOSIS — Z96652 Presence of left artificial knee joint: Secondary | ICD-10-CM | POA: Diagnosis not present

## 2019-10-24 DIAGNOSIS — R262 Difficulty in walking, not elsewhere classified: Secondary | ICD-10-CM | POA: Diagnosis not present

## 2019-10-24 DIAGNOSIS — M6281 Muscle weakness (generalized): Secondary | ICD-10-CM | POA: Diagnosis not present

## 2019-10-24 DIAGNOSIS — M25662 Stiffness of left knee, not elsewhere classified: Secondary | ICD-10-CM | POA: Diagnosis not present

## 2019-10-24 DIAGNOSIS — M25562 Pain in left knee: Secondary | ICD-10-CM | POA: Diagnosis not present

## 2019-10-24 DIAGNOSIS — R600 Localized edema: Secondary | ICD-10-CM | POA: Diagnosis not present

## 2019-11-09 DIAGNOSIS — M8580 Other specified disorders of bone density and structure, unspecified site: Secondary | ICD-10-CM | POA: Diagnosis not present

## 2019-11-09 DIAGNOSIS — C61 Malignant neoplasm of prostate: Secondary | ICD-10-CM | POA: Diagnosis not present

## 2019-11-14 DIAGNOSIS — M4316 Spondylolisthesis, lumbar region: Secondary | ICD-10-CM | POA: Diagnosis not present

## 2019-11-14 DIAGNOSIS — M545 Low back pain: Secondary | ICD-10-CM | POA: Diagnosis not present

## 2019-11-20 DIAGNOSIS — M6281 Muscle weakness (generalized): Secondary | ICD-10-CM | POA: Diagnosis not present

## 2019-11-20 DIAGNOSIS — R2681 Unsteadiness on feet: Secondary | ICD-10-CM | POA: Diagnosis not present

## 2019-11-20 DIAGNOSIS — M5459 Other low back pain: Secondary | ICD-10-CM | POA: Diagnosis not present

## 2019-11-21 DIAGNOSIS — H3509 Other intraretinal microvascular abnormalities: Secondary | ICD-10-CM | POA: Diagnosis not present

## 2019-11-21 DIAGNOSIS — H524 Presbyopia: Secondary | ICD-10-CM | POA: Diagnosis not present

## 2019-11-21 DIAGNOSIS — H5203 Hypermetropia, bilateral: Secondary | ICD-10-CM | POA: Diagnosis not present

## 2019-11-21 DIAGNOSIS — H353120 Nonexudative age-related macular degeneration, left eye, stage unspecified: Secondary | ICD-10-CM | POA: Diagnosis not present

## 2019-11-21 DIAGNOSIS — H5200 Hypermetropia, unspecified eye: Secondary | ICD-10-CM | POA: Diagnosis not present

## 2019-11-23 DIAGNOSIS — N281 Cyst of kidney, acquired: Secondary | ICD-10-CM | POA: Diagnosis not present

## 2019-11-23 DIAGNOSIS — C61 Malignant neoplasm of prostate: Secondary | ICD-10-CM | POA: Diagnosis not present

## 2019-11-23 DIAGNOSIS — D1800 Hemangioma unspecified site: Secondary | ICD-10-CM | POA: Diagnosis not present

## 2019-11-23 DIAGNOSIS — M8580 Other specified disorders of bone density and structure, unspecified site: Secondary | ICD-10-CM | POA: Diagnosis not present

## 2019-11-28 DIAGNOSIS — R32 Unspecified urinary incontinence: Secondary | ICD-10-CM | POA: Diagnosis not present

## 2019-11-28 DIAGNOSIS — M1990 Unspecified osteoarthritis, unspecified site: Secondary | ICD-10-CM | POA: Diagnosis not present

## 2019-11-28 DIAGNOSIS — C61 Malignant neoplasm of prostate: Secondary | ICD-10-CM | POA: Diagnosis not present

## 2019-11-28 DIAGNOSIS — M545 Low back pain, unspecified: Secondary | ICD-10-CM | POA: Diagnosis not present

## 2019-11-28 DIAGNOSIS — R2689 Other abnormalities of gait and mobility: Secondary | ICD-10-CM | POA: Diagnosis not present

## 2019-11-28 DIAGNOSIS — M6281 Muscle weakness (generalized): Secondary | ICD-10-CM | POA: Diagnosis not present

## 2019-11-28 DIAGNOSIS — M5459 Other low back pain: Secondary | ICD-10-CM | POA: Diagnosis not present

## 2019-11-28 DIAGNOSIS — K219 Gastro-esophageal reflux disease without esophagitis: Secondary | ICD-10-CM | POA: Diagnosis not present

## 2019-11-28 DIAGNOSIS — Z96659 Presence of unspecified artificial knee joint: Secondary | ICD-10-CM | POA: Diagnosis not present

## 2019-11-28 DIAGNOSIS — G8929 Other chronic pain: Secondary | ICD-10-CM | POA: Diagnosis not present

## 2019-11-28 DIAGNOSIS — R2681 Unsteadiness on feet: Secondary | ICD-10-CM | POA: Diagnosis not present

## 2019-11-28 DIAGNOSIS — M810 Age-related osteoporosis without current pathological fracture: Secondary | ICD-10-CM | POA: Diagnosis not present

## 2019-11-28 DIAGNOSIS — Z4889 Encounter for other specified surgical aftercare: Secondary | ICD-10-CM | POA: Diagnosis not present

## 2019-11-28 DIAGNOSIS — R030 Elevated blood-pressure reading, without diagnosis of hypertension: Secondary | ICD-10-CM | POA: Diagnosis not present

## 2019-11-28 DIAGNOSIS — Z96652 Presence of left artificial knee joint: Secondary | ICD-10-CM | POA: Diagnosis not present

## 2019-11-30 DIAGNOSIS — H02413 Mechanical ptosis of bilateral eyelids: Secondary | ICD-10-CM | POA: Diagnosis not present

## 2019-11-30 DIAGNOSIS — H52223 Regular astigmatism, bilateral: Secondary | ICD-10-CM | POA: Diagnosis not present

## 2019-11-30 DIAGNOSIS — H18513 Endothelial corneal dystrophy, bilateral: Secondary | ICD-10-CM | POA: Diagnosis not present

## 2019-11-30 DIAGNOSIS — H2513 Age-related nuclear cataract, bilateral: Secondary | ICD-10-CM | POA: Diagnosis not present

## 2019-11-30 DIAGNOSIS — H02882 Meibomian gland dysfunction right lower eyelid: Secondary | ICD-10-CM | POA: Diagnosis not present

## 2019-11-30 DIAGNOSIS — H02886 Meibomian gland dysfunction of left eye, unspecified eyelid: Secondary | ICD-10-CM | POA: Diagnosis not present

## 2019-11-30 DIAGNOSIS — H353121 Nonexudative age-related macular degeneration, left eye, early dry stage: Secondary | ICD-10-CM | POA: Diagnosis not present

## 2019-12-01 DIAGNOSIS — R2681 Unsteadiness on feet: Secondary | ICD-10-CM | POA: Diagnosis not present

## 2019-12-01 DIAGNOSIS — M5459 Other low back pain: Secondary | ICD-10-CM | POA: Diagnosis not present

## 2019-12-01 DIAGNOSIS — M6281 Muscle weakness (generalized): Secondary | ICD-10-CM | POA: Diagnosis not present

## 2019-12-14 DIAGNOSIS — M6281 Muscle weakness (generalized): Secondary | ICD-10-CM | POA: Diagnosis not present

## 2019-12-14 DIAGNOSIS — R2681 Unsteadiness on feet: Secondary | ICD-10-CM | POA: Diagnosis not present

## 2019-12-14 DIAGNOSIS — M5459 Other low back pain: Secondary | ICD-10-CM | POA: Diagnosis not present

## 2019-12-18 DIAGNOSIS — M6281 Muscle weakness (generalized): Secondary | ICD-10-CM | POA: Diagnosis not present

## 2019-12-18 DIAGNOSIS — R2681 Unsteadiness on feet: Secondary | ICD-10-CM | POA: Diagnosis not present

## 2019-12-18 DIAGNOSIS — M5459 Other low back pain: Secondary | ICD-10-CM | POA: Diagnosis not present

## 2019-12-21 DIAGNOSIS — R2681 Unsteadiness on feet: Secondary | ICD-10-CM | POA: Diagnosis not present

## 2019-12-21 DIAGNOSIS — M5459 Other low back pain: Secondary | ICD-10-CM | POA: Diagnosis not present

## 2019-12-21 DIAGNOSIS — M6281 Muscle weakness (generalized): Secondary | ICD-10-CM | POA: Diagnosis not present

## 2019-12-25 DIAGNOSIS — R2681 Unsteadiness on feet: Secondary | ICD-10-CM | POA: Diagnosis not present

## 2019-12-25 DIAGNOSIS — M869 Osteomyelitis, unspecified: Secondary | ICD-10-CM | POA: Diagnosis not present

## 2019-12-25 DIAGNOSIS — L709 Acne, unspecified: Secondary | ICD-10-CM | POA: Diagnosis not present

## 2019-12-25 DIAGNOSIS — M8580 Other specified disorders of bone density and structure, unspecified site: Secondary | ICD-10-CM | POA: Diagnosis not present

## 2019-12-25 DIAGNOSIS — M6281 Muscle weakness (generalized): Secondary | ICD-10-CM | POA: Diagnosis not present

## 2019-12-25 DIAGNOSIS — M659 Synovitis and tenosynovitis, unspecified: Secondary | ICD-10-CM | POA: Diagnosis not present

## 2019-12-25 DIAGNOSIS — Z131 Encounter for screening for diabetes mellitus: Secondary | ICD-10-CM | POA: Diagnosis not present

## 2019-12-25 DIAGNOSIS — Z136 Encounter for screening for cardiovascular disorders: Secondary | ICD-10-CM | POA: Diagnosis not present

## 2019-12-25 DIAGNOSIS — M5459 Other low back pain: Secondary | ICD-10-CM | POA: Diagnosis not present

## 2019-12-25 DIAGNOSIS — C61 Malignant neoplasm of prostate: Secondary | ICD-10-CM | POA: Diagnosis not present

## 2019-12-25 DIAGNOSIS — M810 Age-related osteoporosis without current pathological fracture: Secondary | ICD-10-CM | POA: Diagnosis not present

## 2019-12-25 DIAGNOSIS — L403 Pustulosis palmaris et plantaris: Secondary | ICD-10-CM | POA: Diagnosis not present

## 2019-12-27 DIAGNOSIS — M6281 Muscle weakness (generalized): Secondary | ICD-10-CM | POA: Diagnosis not present

## 2019-12-27 DIAGNOSIS — M5459 Other low back pain: Secondary | ICD-10-CM | POA: Diagnosis not present

## 2019-12-27 DIAGNOSIS — R2681 Unsteadiness on feet: Secondary | ICD-10-CM | POA: Diagnosis not present

## 2020-01-01 DIAGNOSIS — R2681 Unsteadiness on feet: Secondary | ICD-10-CM | POA: Diagnosis not present

## 2020-01-01 DIAGNOSIS — M6281 Muscle weakness (generalized): Secondary | ICD-10-CM | POA: Diagnosis not present

## 2020-01-01 DIAGNOSIS — M5459 Other low back pain: Secondary | ICD-10-CM | POA: Diagnosis not present

## 2020-01-02 DIAGNOSIS — R7301 Impaired fasting glucose: Secondary | ICD-10-CM | POA: Diagnosis not present

## 2020-01-02 DIAGNOSIS — C61 Malignant neoplasm of prostate: Secondary | ICD-10-CM | POA: Diagnosis not present

## 2020-01-02 DIAGNOSIS — E785 Hyperlipidemia, unspecified: Secondary | ICD-10-CM | POA: Diagnosis not present

## 2020-01-02 DIAGNOSIS — M810 Age-related osteoporosis without current pathological fracture: Secondary | ICD-10-CM | POA: Diagnosis not present

## 2020-01-02 DIAGNOSIS — Z23 Encounter for immunization: Secondary | ICD-10-CM | POA: Diagnosis not present

## 2020-01-02 DIAGNOSIS — Z0000 Encounter for general adult medical examination without abnormal findings: Secondary | ICD-10-CM | POA: Diagnosis not present

## 2020-01-03 DIAGNOSIS — Z882 Allergy status to sulfonamides status: Secondary | ICD-10-CM | POA: Diagnosis not present

## 2020-01-03 DIAGNOSIS — Z888 Allergy status to other drugs, medicaments and biological substances status: Secondary | ICD-10-CM | POA: Diagnosis not present

## 2020-01-03 DIAGNOSIS — Z9079 Acquired absence of other genital organ(s): Secondary | ICD-10-CM | POA: Diagnosis not present

## 2020-01-03 DIAGNOSIS — Z8546 Personal history of malignant neoplasm of prostate: Secondary | ICD-10-CM | POA: Diagnosis not present

## 2020-01-03 DIAGNOSIS — Z79899 Other long term (current) drug therapy: Secondary | ICD-10-CM | POA: Diagnosis not present

## 2020-01-03 DIAGNOSIS — H25812 Combined forms of age-related cataract, left eye: Secondary | ICD-10-CM | POA: Diagnosis not present

## 2020-01-03 DIAGNOSIS — Z79818 Long term (current) use of other agents affecting estrogen receptors and estrogen levels: Secondary | ICD-10-CM | POA: Diagnosis not present

## 2020-01-03 DIAGNOSIS — Z9889 Other specified postprocedural states: Secondary | ICD-10-CM | POA: Diagnosis not present

## 2020-01-03 DIAGNOSIS — H2512 Age-related nuclear cataract, left eye: Secondary | ICD-10-CM | POA: Diagnosis not present

## 2020-01-08 DIAGNOSIS — M6281 Muscle weakness (generalized): Secondary | ICD-10-CM | POA: Diagnosis not present

## 2020-01-08 DIAGNOSIS — R2681 Unsteadiness on feet: Secondary | ICD-10-CM | POA: Diagnosis not present

## 2020-01-08 DIAGNOSIS — M5459 Other low back pain: Secondary | ICD-10-CM | POA: Diagnosis not present

## 2020-01-10 DIAGNOSIS — M6281 Muscle weakness (generalized): Secondary | ICD-10-CM | POA: Diagnosis not present

## 2020-01-10 DIAGNOSIS — R2681 Unsteadiness on feet: Secondary | ICD-10-CM | POA: Diagnosis not present

## 2020-01-10 DIAGNOSIS — M5459 Other low back pain: Secondary | ICD-10-CM | POA: Diagnosis not present

## 2020-01-12 DIAGNOSIS — H25812 Combined forms of age-related cataract, left eye: Secondary | ICD-10-CM | POA: Diagnosis not present

## 2020-01-15 DIAGNOSIS — M5459 Other low back pain: Secondary | ICD-10-CM | POA: Diagnosis not present

## 2020-01-15 DIAGNOSIS — M6281 Muscle weakness (generalized): Secondary | ICD-10-CM | POA: Diagnosis not present

## 2020-01-15 DIAGNOSIS — R2681 Unsteadiness on feet: Secondary | ICD-10-CM | POA: Diagnosis not present

## 2020-01-19 DIAGNOSIS — M6281 Muscle weakness (generalized): Secondary | ICD-10-CM | POA: Diagnosis not present

## 2020-01-19 DIAGNOSIS — R2681 Unsteadiness on feet: Secondary | ICD-10-CM | POA: Diagnosis not present

## 2020-01-19 DIAGNOSIS — M5459 Other low back pain: Secondary | ICD-10-CM | POA: Diagnosis not present

## 2020-01-22 DIAGNOSIS — R2681 Unsteadiness on feet: Secondary | ICD-10-CM | POA: Diagnosis not present

## 2020-01-22 DIAGNOSIS — M5459 Other low back pain: Secondary | ICD-10-CM | POA: Diagnosis not present

## 2020-01-22 DIAGNOSIS — M6281 Muscle weakness (generalized): Secondary | ICD-10-CM | POA: Diagnosis not present

## 2020-01-25 DIAGNOSIS — M5459 Other low back pain: Secondary | ICD-10-CM | POA: Diagnosis not present

## 2020-01-25 DIAGNOSIS — R2681 Unsteadiness on feet: Secondary | ICD-10-CM | POA: Diagnosis not present

## 2020-01-25 DIAGNOSIS — M6281 Muscle weakness (generalized): Secondary | ICD-10-CM | POA: Diagnosis not present

## 2020-01-29 DIAGNOSIS — Z20822 Contact with and (suspected) exposure to covid-19: Secondary | ICD-10-CM | POA: Diagnosis not present

## 2020-01-29 DIAGNOSIS — Z87891 Personal history of nicotine dependence: Secondary | ICD-10-CM | POA: Diagnosis not present

## 2020-01-29 DIAGNOSIS — K219 Gastro-esophageal reflux disease without esophagitis: Secondary | ICD-10-CM | POA: Diagnosis not present

## 2020-01-29 DIAGNOSIS — H5703 Miosis: Secondary | ICD-10-CM | POA: Diagnosis not present

## 2020-01-29 DIAGNOSIS — H2181 Floppy iris syndrome: Secondary | ICD-10-CM | POA: Diagnosis not present

## 2020-01-29 DIAGNOSIS — E785 Hyperlipidemia, unspecified: Secondary | ICD-10-CM | POA: Diagnosis not present

## 2020-01-29 DIAGNOSIS — H25811 Combined forms of age-related cataract, right eye: Secondary | ICD-10-CM | POA: Diagnosis not present

## 2020-01-29 DIAGNOSIS — H2511 Age-related nuclear cataract, right eye: Secondary | ICD-10-CM | POA: Diagnosis not present

## 2020-01-29 DIAGNOSIS — Z8546 Personal history of malignant neoplasm of prostate: Secondary | ICD-10-CM | POA: Diagnosis not present

## 2020-01-29 DIAGNOSIS — H9190 Unspecified hearing loss, unspecified ear: Secondary | ICD-10-CM | POA: Diagnosis not present

## 2020-01-30 DIAGNOSIS — M4726 Other spondylosis with radiculopathy, lumbar region: Secondary | ICD-10-CM | POA: Diagnosis not present

## 2020-02-12 DIAGNOSIS — R2681 Unsteadiness on feet: Secondary | ICD-10-CM | POA: Diagnosis not present

## 2020-02-12 DIAGNOSIS — M6281 Muscle weakness (generalized): Secondary | ICD-10-CM | POA: Diagnosis not present

## 2020-02-12 DIAGNOSIS — M5459 Other low back pain: Secondary | ICD-10-CM | POA: Diagnosis not present

## 2020-02-13 DIAGNOSIS — M5126 Other intervertebral disc displacement, lumbar region: Secondary | ICD-10-CM | POA: Diagnosis not present

## 2020-02-13 DIAGNOSIS — M4726 Other spondylosis with radiculopathy, lumbar region: Secondary | ICD-10-CM | POA: Diagnosis not present

## 2020-02-14 DIAGNOSIS — Z20822 Contact with and (suspected) exposure to covid-19: Secondary | ICD-10-CM | POA: Diagnosis not present

## 2020-02-14 DIAGNOSIS — R0981 Nasal congestion: Secondary | ICD-10-CM | POA: Diagnosis not present

## 2020-02-20 DIAGNOSIS — M6281 Muscle weakness (generalized): Secondary | ICD-10-CM | POA: Diagnosis not present

## 2020-02-20 DIAGNOSIS — C61 Malignant neoplasm of prostate: Secondary | ICD-10-CM | POA: Diagnosis not present

## 2020-02-20 DIAGNOSIS — M5459 Other low back pain: Secondary | ICD-10-CM | POA: Diagnosis not present

## 2020-02-20 DIAGNOSIS — R2681 Unsteadiness on feet: Secondary | ICD-10-CM | POA: Diagnosis not present

## 2020-02-23 DIAGNOSIS — M5459 Other low back pain: Secondary | ICD-10-CM | POA: Diagnosis not present

## 2020-02-23 DIAGNOSIS — M6281 Muscle weakness (generalized): Secondary | ICD-10-CM | POA: Diagnosis not present

## 2020-02-23 DIAGNOSIS — R2681 Unsteadiness on feet: Secondary | ICD-10-CM | POA: Diagnosis not present

## 2020-02-26 DIAGNOSIS — R2681 Unsteadiness on feet: Secondary | ICD-10-CM | POA: Diagnosis not present

## 2020-02-26 DIAGNOSIS — M6281 Muscle weakness (generalized): Secondary | ICD-10-CM | POA: Diagnosis not present

## 2020-02-26 DIAGNOSIS — M5459 Other low back pain: Secondary | ICD-10-CM | POA: Diagnosis not present

## 2020-02-27 DIAGNOSIS — H3091 Unspecified chorioretinal inflammation, right eye: Secondary | ICD-10-CM | POA: Diagnosis not present

## 2020-02-27 DIAGNOSIS — H35033 Hypertensive retinopathy, bilateral: Secondary | ICD-10-CM | POA: Diagnosis not present

## 2020-02-27 DIAGNOSIS — Z961 Presence of intraocular lens: Secondary | ICD-10-CM | POA: Diagnosis not present

## 2020-02-28 DIAGNOSIS — M6281 Muscle weakness (generalized): Secondary | ICD-10-CM | POA: Diagnosis not present

## 2020-02-28 DIAGNOSIS — M5459 Other low back pain: Secondary | ICD-10-CM | POA: Diagnosis not present

## 2020-02-28 DIAGNOSIS — R2681 Unsteadiness on feet: Secondary | ICD-10-CM | POA: Diagnosis not present

## 2020-02-29 DIAGNOSIS — N281 Cyst of kidney, acquired: Secondary | ICD-10-CM | POA: Diagnosis not present

## 2020-02-29 DIAGNOSIS — C61 Malignant neoplasm of prostate: Secondary | ICD-10-CM | POA: Diagnosis not present

## 2020-02-29 DIAGNOSIS — N393 Stress incontinence (female) (male): Secondary | ICD-10-CM | POA: Diagnosis not present

## 2020-02-29 DIAGNOSIS — M8580 Other specified disorders of bone density and structure, unspecified site: Secondary | ICD-10-CM | POA: Diagnosis not present

## 2020-03-04 DIAGNOSIS — M5459 Other low back pain: Secondary | ICD-10-CM | POA: Diagnosis not present

## 2020-03-04 DIAGNOSIS — R2681 Unsteadiness on feet: Secondary | ICD-10-CM | POA: Diagnosis not present

## 2020-03-04 DIAGNOSIS — M6281 Muscle weakness (generalized): Secondary | ICD-10-CM | POA: Diagnosis not present

## 2020-03-06 DIAGNOSIS — M5459 Other low back pain: Secondary | ICD-10-CM | POA: Diagnosis not present

## 2020-03-06 DIAGNOSIS — M6281 Muscle weakness (generalized): Secondary | ICD-10-CM | POA: Diagnosis not present

## 2020-03-06 DIAGNOSIS — R2681 Unsteadiness on feet: Secondary | ICD-10-CM | POA: Diagnosis not present

## 2020-03-11 DIAGNOSIS — M6281 Muscle weakness (generalized): Secondary | ICD-10-CM | POA: Diagnosis not present

## 2020-03-11 DIAGNOSIS — M5459 Other low back pain: Secondary | ICD-10-CM | POA: Diagnosis not present

## 2020-03-11 DIAGNOSIS — R2681 Unsteadiness on feet: Secondary | ICD-10-CM | POA: Diagnosis not present

## 2020-03-18 DIAGNOSIS — M6281 Muscle weakness (generalized): Secondary | ICD-10-CM | POA: Diagnosis not present

## 2020-03-18 DIAGNOSIS — M5459 Other low back pain: Secondary | ICD-10-CM | POA: Diagnosis not present

## 2020-03-18 DIAGNOSIS — R2681 Unsteadiness on feet: Secondary | ICD-10-CM | POA: Diagnosis not present

## 2020-03-19 DIAGNOSIS — M4316 Spondylolisthesis, lumbar region: Secondary | ICD-10-CM | POA: Diagnosis not present

## 2020-04-04 DIAGNOSIS — M4310 Spondylolisthesis, site unspecified: Secondary | ICD-10-CM | POA: Diagnosis not present

## 2020-04-04 DIAGNOSIS — M5416 Radiculopathy, lumbar region: Secondary | ICD-10-CM | POA: Diagnosis not present

## 2020-04-09 DIAGNOSIS — H3091 Unspecified chorioretinal inflammation, right eye: Secondary | ICD-10-CM | POA: Diagnosis not present

## 2020-04-09 DIAGNOSIS — H35033 Hypertensive retinopathy, bilateral: Secondary | ICD-10-CM | POA: Diagnosis not present

## 2020-04-09 DIAGNOSIS — Z961 Presence of intraocular lens: Secondary | ICD-10-CM | POA: Diagnosis not present

## 2020-04-16 DIAGNOSIS — M4310 Spondylolisthesis, site unspecified: Secondary | ICD-10-CM | POA: Diagnosis not present

## 2020-04-29 DIAGNOSIS — Z79899 Other long term (current) drug therapy: Secondary | ICD-10-CM | POA: Diagnosis not present

## 2020-04-29 DIAGNOSIS — K219 Gastro-esophageal reflux disease without esophagitis: Secondary | ICD-10-CM | POA: Diagnosis not present

## 2020-04-29 DIAGNOSIS — Z811 Family history of alcohol abuse and dependence: Secondary | ICD-10-CM | POA: Diagnosis not present

## 2020-04-29 DIAGNOSIS — M810 Age-related osteoporosis without current pathological fracture: Secondary | ICD-10-CM | POA: Diagnosis not present

## 2020-04-29 DIAGNOSIS — N529 Male erectile dysfunction, unspecified: Secondary | ICD-10-CM | POA: Diagnosis not present

## 2020-04-29 DIAGNOSIS — R030 Elevated blood-pressure reading, without diagnosis of hypertension: Secondary | ICD-10-CM | POA: Diagnosis not present

## 2020-04-29 DIAGNOSIS — C61 Malignant neoplasm of prostate: Secondary | ICD-10-CM | POA: Diagnosis not present

## 2020-04-29 DIAGNOSIS — Z8249 Family history of ischemic heart disease and other diseases of the circulatory system: Secondary | ICD-10-CM | POA: Diagnosis not present

## 2020-04-29 DIAGNOSIS — R32 Unspecified urinary incontinence: Secondary | ICD-10-CM | POA: Diagnosis not present

## 2020-05-07 DIAGNOSIS — Z961 Presence of intraocular lens: Secondary | ICD-10-CM | POA: Diagnosis not present

## 2020-05-07 DIAGNOSIS — H3091 Unspecified chorioretinal inflammation, right eye: Secondary | ICD-10-CM | POA: Diagnosis not present

## 2020-05-07 DIAGNOSIS — H35033 Hypertensive retinopathy, bilateral: Secondary | ICD-10-CM | POA: Diagnosis not present

## 2020-05-16 DIAGNOSIS — L84 Corns and callosities: Secondary | ICD-10-CM | POA: Diagnosis not present

## 2020-06-03 DIAGNOSIS — H5200 Hypermetropia, unspecified eye: Secondary | ICD-10-CM | POA: Diagnosis not present

## 2020-06-03 DIAGNOSIS — Z961 Presence of intraocular lens: Secondary | ICD-10-CM | POA: Diagnosis not present

## 2020-06-03 DIAGNOSIS — H353120 Nonexudative age-related macular degeneration, left eye, stage unspecified: Secondary | ICD-10-CM | POA: Diagnosis not present

## 2020-06-03 DIAGNOSIS — H524 Presbyopia: Secondary | ICD-10-CM | POA: Diagnosis not present

## 2020-06-03 DIAGNOSIS — H31001 Unspecified chorioretinal scars, right eye: Secondary | ICD-10-CM | POA: Diagnosis not present

## 2020-06-05 DIAGNOSIS — C61 Malignant neoplasm of prostate: Secondary | ICD-10-CM | POA: Diagnosis not present

## 2020-06-10 ENCOUNTER — Telehealth: Payer: Self-pay | Admitting: Family Medicine

## 2020-06-10 DIAGNOSIS — H3091 Unspecified chorioretinal inflammation, right eye: Secondary | ICD-10-CM | POA: Diagnosis not present

## 2020-06-10 DIAGNOSIS — Z961 Presence of intraocular lens: Secondary | ICD-10-CM | POA: Diagnosis not present

## 2020-06-10 DIAGNOSIS — H35033 Hypertensive retinopathy, bilateral: Secondary | ICD-10-CM | POA: Diagnosis not present

## 2020-06-10 NOTE — Telephone Encounter (Signed)
Copied from [place] [phone]. Topic: Medicare AWV >> Jun 10, 2020  9:28 AM Cher Nakai R wrote: Reason for CRM:  Left message for patient to call back and schedule Medicare Annual Wellness Visit (AWV) in office.   If not able to come in office, please offer to do virtually or by telephone.   Last AWV: 03/01/2017  Please schedule at anytime with [facility]-Nurse Health Advisor.  If any questions, please contact me at [phone]

## 2020-06-20 DIAGNOSIS — M8580 Other specified disorders of bone density and structure, unspecified site: Secondary | ICD-10-CM | POA: Diagnosis not present

## 2020-06-20 DIAGNOSIS — C61 Malignant neoplasm of prostate: Secondary | ICD-10-CM | POA: Diagnosis not present

## 2020-06-20 DIAGNOSIS — N393 Stress incontinence (female) (male): Secondary | ICD-10-CM | POA: Diagnosis not present

## 2020-06-20 DIAGNOSIS — D1800 Hemangioma unspecified site: Secondary | ICD-10-CM | POA: Diagnosis not present

## 2020-07-24 DIAGNOSIS — D1801 Hemangioma of skin and subcutaneous tissue: Secondary | ICD-10-CM | POA: Diagnosis not present

## 2020-07-24 DIAGNOSIS — L820 Inflamed seborrheic keratosis: Secondary | ICD-10-CM | POA: Diagnosis not present

## 2020-07-24 DIAGNOSIS — L814 Other melanin hyperpigmentation: Secondary | ICD-10-CM | POA: Diagnosis not present

## 2020-07-24 DIAGNOSIS — L570 Actinic keratosis: Secondary | ICD-10-CM | POA: Diagnosis not present

## 2020-07-30 DIAGNOSIS — M2042 Other hammer toe(s) (acquired), left foot: Secondary | ICD-10-CM | POA: Diagnosis not present

## 2020-07-30 DIAGNOSIS — L84 Corns and callosities: Secondary | ICD-10-CM | POA: Diagnosis not present

## 2020-09-02 DIAGNOSIS — Z96652 Presence of left artificial knee joint: Secondary | ICD-10-CM | POA: Diagnosis not present

## 2020-09-10 DIAGNOSIS — H35033 Hypertensive retinopathy, bilateral: Secondary | ICD-10-CM | POA: Diagnosis not present

## 2020-09-10 DIAGNOSIS — H3091 Unspecified chorioretinal inflammation, right eye: Secondary | ICD-10-CM | POA: Diagnosis not present

## 2020-09-10 DIAGNOSIS — Z961 Presence of intraocular lens: Secondary | ICD-10-CM | POA: Diagnosis not present

## 2020-09-13 ENCOUNTER — Other Ambulatory Visit (HOSPITAL_COMMUNITY): Payer: Self-pay | Admitting: Urology

## 2020-09-13 ENCOUNTER — Other Ambulatory Visit: Payer: Self-pay | Admitting: Urology

## 2020-09-13 DIAGNOSIS — C61 Malignant neoplasm of prostate: Secondary | ICD-10-CM

## 2020-09-30 ENCOUNTER — Other Ambulatory Visit: Payer: Self-pay

## 2020-09-30 ENCOUNTER — Ambulatory Visit (HOSPITAL_COMMUNITY)
Admission: RE | Admit: 2020-09-30 | Discharge: 2020-09-30 | Disposition: A | Discharge status: Home / Self Care | Payer: Medicare PPO | Source: Ambulatory Visit | Attending: Urology | Admitting: Urology

## 2020-09-30 ENCOUNTER — Encounter (HOSPITAL_COMMUNITY)
Admission: RE | Admit: 2020-09-30 | Discharge: 2020-09-30 | Disposition: A | Discharge status: Home / Self Care | Payer: Medicare PPO | Source: Ambulatory Visit | Attending: Urology | Admitting: Urology

## 2020-09-30 DIAGNOSIS — R972 Elevated prostate specific antigen [PSA]: Secondary | ICD-10-CM | POA: Diagnosis not present

## 2020-09-30 DIAGNOSIS — C61 Malignant neoplasm of prostate: Secondary | ICD-10-CM | POA: Insufficient documentation

## 2020-09-30 DIAGNOSIS — Z8546 Personal history of malignant neoplasm of prostate: Secondary | ICD-10-CM | POA: Diagnosis not present

## 2020-09-30 MED ORDER — TECHNETIUM TC 99M MEDRONATE IV KIT
20.0000 | PACK | Freq: Once | INTRAVENOUS | Status: AC | PRN
  Administered 2020-09-30: 21.8 via INTRAVENOUS

## 2020-10-24 DIAGNOSIS — N393 Stress incontinence (female) (male): Secondary | ICD-10-CM | POA: Diagnosis not present

## 2020-10-24 DIAGNOSIS — C7951 Secondary malignant neoplasm of bone: Secondary | ICD-10-CM | POA: Diagnosis not present

## 2020-10-24 DIAGNOSIS — C61 Malignant neoplasm of prostate: Secondary | ICD-10-CM | POA: Diagnosis not present

## 2020-10-24 DIAGNOSIS — M8580 Other specified disorders of bone density and structure, unspecified site: Secondary | ICD-10-CM | POA: Diagnosis not present

## 2020-11-14 ENCOUNTER — Encounter (HOSPITAL_COMMUNITY): Payer: Self-pay | Admitting: Radiology

## 2020-12-17 DIAGNOSIS — H3091 Unspecified chorioretinal inflammation, right eye: Secondary | ICD-10-CM | POA: Diagnosis not present

## 2020-12-17 DIAGNOSIS — Z961 Presence of intraocular lens: Secondary | ICD-10-CM | POA: Diagnosis not present

## 2020-12-17 DIAGNOSIS — H35033 Hypertensive retinopathy, bilateral: Secondary | ICD-10-CM | POA: Diagnosis not present

## 2020-12-27 DIAGNOSIS — R7301 Impaired fasting glucose: Secondary | ICD-10-CM | POA: Diagnosis not present

## 2020-12-27 DIAGNOSIS — C61 Malignant neoplasm of prostate: Secondary | ICD-10-CM | POA: Diagnosis not present

## 2020-12-27 DIAGNOSIS — E785 Hyperlipidemia, unspecified: Secondary | ICD-10-CM | POA: Diagnosis not present

## 2020-12-27 DIAGNOSIS — M810 Age-related osteoporosis without current pathological fracture: Secondary | ICD-10-CM | POA: Diagnosis not present

## 2020-12-27 DIAGNOSIS — Z0000 Encounter for general adult medical examination without abnormal findings: Secondary | ICD-10-CM | POA: Diagnosis not present

## 2021-01-03 DIAGNOSIS — R7301 Impaired fasting glucose: Secondary | ICD-10-CM | POA: Diagnosis not present

## 2021-01-03 DIAGNOSIS — M810 Age-related osteoporosis without current pathological fracture: Secondary | ICD-10-CM | POA: Diagnosis not present

## 2021-01-03 DIAGNOSIS — E785 Hyperlipidemia, unspecified: Secondary | ICD-10-CM | POA: Diagnosis not present

## 2021-01-03 DIAGNOSIS — Z0000 Encounter for general adult medical examination without abnormal findings: Secondary | ICD-10-CM | POA: Diagnosis not present

## 2021-01-03 DIAGNOSIS — C61 Malignant neoplasm of prostate: Secondary | ICD-10-CM | POA: Diagnosis not present

## 2021-01-03 DIAGNOSIS — I499 Cardiac arrhythmia, unspecified: Secondary | ICD-10-CM | POA: Diagnosis not present

## 2021-01-21 DIAGNOSIS — M5416 Radiculopathy, lumbar region: Secondary | ICD-10-CM | POA: Diagnosis not present

## 2021-01-22 DIAGNOSIS — L570 Actinic keratosis: Secondary | ICD-10-CM | POA: Diagnosis not present

## 2021-01-22 DIAGNOSIS — C44319 Basal cell carcinoma of skin of other parts of face: Secondary | ICD-10-CM | POA: Diagnosis not present

## 2021-01-22 DIAGNOSIS — D485 Neoplasm of uncertain behavior of skin: Secondary | ICD-10-CM | POA: Diagnosis not present

## 2021-01-27 DIAGNOSIS — R972 Elevated prostate specific antigen [PSA]: Secondary | ICD-10-CM | POA: Diagnosis not present

## 2021-02-13 DIAGNOSIS — C7951 Secondary malignant neoplasm of bone: Secondary | ICD-10-CM | POA: Diagnosis not present

## 2021-02-13 DIAGNOSIS — C61 Malignant neoplasm of prostate: Secondary | ICD-10-CM | POA: Diagnosis not present

## 2021-02-13 DIAGNOSIS — N393 Stress incontinence (female) (male): Secondary | ICD-10-CM | POA: Diagnosis not present

## 2021-10-01 IMAGING — NM NM BONE WHOLE BODY
2 series · 2 of 2 positions shown · non-contrast
Comparison: Bone scan 06/29/2018

CLINICAL DATA: History of prostate cancer.  Elevated PSA.

EXAM:
NUCLEAR MEDICINE WHOLE BODY BONE SCAN
TECHNIQUE: Whole body anterior and posterior images were obtained approximately
3 hours after intravenous injection of radiopharmaceutical.
RADIOPHARMACEUTICALS:  21.8 mCi 3echnetium-FFm MDP IV

[Series 1: whole body · 2.66mm/px · 1 of 1 slices shown (1 of 2)]
[im 1/1]
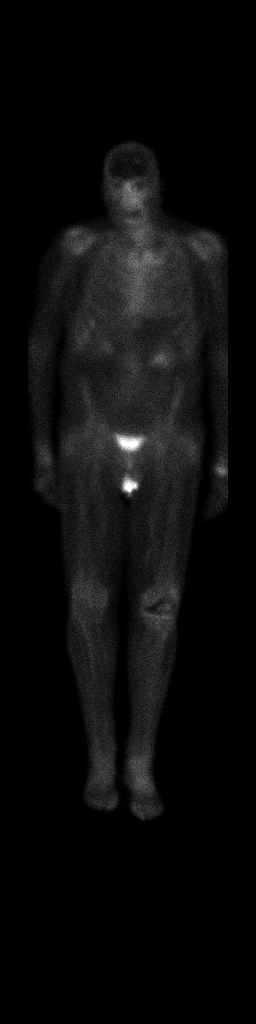

[Series 1: whole body · 2.66mm/px · 1 of 1 slices shown (2 of 2)]
[im 1/1]
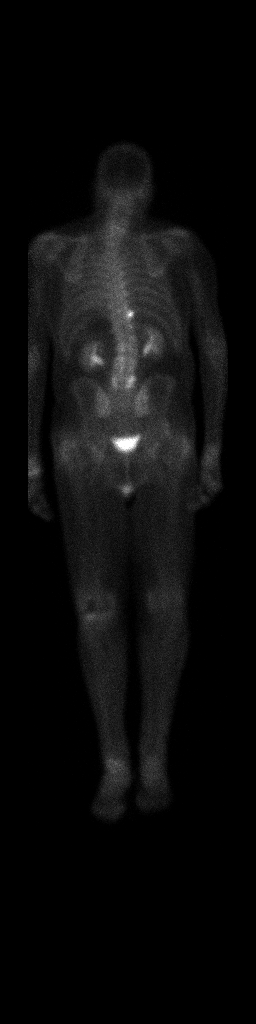

[2 of 2 positions shown; findings below may reference images not displayed]

FINDINGS: Bilateral renal function excretion. Punctate area of increased
activity noted over the right lateral aspect of a lower thoracic
vertebral body/medial aspect of adjacent rib. Further evaluation
with thoracic spine MRI is suggested. No other abnormal bony
activity noted. Evidence of prior left knee replacement.
IMPRESSION: Punctate area of increased activity noted over the right lateral
aspect of a lower thoracic vertebral body/medial aspect of adjacent
rib. Further evaluation with thoracic spine MRI is suggested. No
other abnormal bony activity identified.

## 2022-03-09 ENCOUNTER — Other Ambulatory Visit (HOSPITAL_COMMUNITY): Payer: Self-pay | Admitting: Urology

## 2022-03-09 DIAGNOSIS — C61 Malignant neoplasm of prostate: Secondary | ICD-10-CM

## 2022-03-23 ENCOUNTER — Encounter (HOSPITAL_COMMUNITY)
Admission: RE | Admit: 2022-03-23 | Discharge: 2022-03-23 | Disposition: A | Discharge status: Home / Self Care | Payer: Medicare PPO | Source: Ambulatory Visit | Attending: Urology | Admitting: Urology

## 2022-03-23 ENCOUNTER — Ambulatory Visit (HOSPITAL_COMMUNITY)
Admission: RE | Admit: 2022-03-23 | Discharge: 2022-03-23 | Disposition: A | Discharge status: Home / Self Care | Payer: Medicare PPO | Source: Ambulatory Visit | Attending: Urology | Admitting: Urology

## 2022-03-23 ENCOUNTER — Ambulatory Visit (HOSPITAL_COMMUNITY): Payer: Medicare PPO

## 2022-03-23 ENCOUNTER — Encounter (HOSPITAL_COMMUNITY): Payer: Self-pay

## 2022-03-23 DIAGNOSIS — C61 Malignant neoplasm of prostate: Secondary | ICD-10-CM | POA: Diagnosis present

## 2022-03-23 MED ORDER — TECHNETIUM TC 99M MEDRONATE IV KIT
20.0000 | PACK | Freq: Once | INTRAVENOUS | Status: AC | PRN
  Administered 2022-03-23: 21.5 via INTRAVENOUS

## 2022-09-16 ENCOUNTER — Other Ambulatory Visit (HOSPITAL_COMMUNITY): Payer: Self-pay | Admitting: Urology

## 2022-09-16 DIAGNOSIS — C61 Malignant neoplasm of prostate: Secondary | ICD-10-CM

## 2022-10-07 ENCOUNTER — Ambulatory Visit (HOSPITAL_COMMUNITY)
Admission: RE | Admit: 2022-10-07 | Discharge: 2022-10-07 | Disposition: A | Discharge status: Home / Self Care | Payer: Medicare PPO | Source: Ambulatory Visit | Attending: Urology | Admitting: Urology

## 2022-10-07 DIAGNOSIS — C61 Malignant neoplasm of prostate: Secondary | ICD-10-CM | POA: Diagnosis present

## 2022-10-07 MED ORDER — PIFLIFOLASTAT F 18 (PYLARIFY) INJECTION
9.0000 | Freq: Once | INTRAVENOUS | Status: AC
  Administered 2022-10-07: 8.7 via INTRAVENOUS

## 2022-10-27 NOTE — Progress Notes (Signed)
GU Location of Tumor / Histology: Metastatic Prostate Ca (biochemical recurrence)  Radical Prostatectomy (2005) Dr. Aldean Ast  PSA  194.55 on 10/06/2022 PSA  193.15 on 07/06/2022  Todd Riley presents today as referral from Dr. Sebastian Ache to discuss possible Xofigo treatment for innumerable bone mets  10/07/2022  Dr. Sebastian Ache NM PET (PSMA) Skull to Mid Thigh Prostate carcinoma with biochemical recurrence. Radical prostatectomy 30 years prior. Rising PSA equal 195.  IMPRESSION: 1. Intense radiotracer activity associated with innumerable small skeletal metastasis involving essentially every bone. Lesions too numerous to count. 2. Intense radiotracer activity associated with hazy retroperitoneal tissue LEFT of the aorta extending into the porta hepatis and peritoneal surfaces of the liver consistent with recurrent prostate cancer. 3. Intense radiotracer activity throughout the pancreas is favored prostate cancer metastasis. 4. Small radiotracer avid mediastinal lymph nodes consistent with mediastinal nodal metastasis. 5. No evidence of local recurrence in the prostatectomy bed.  03/23/2022 Dr. Sebastian Ache NM Bone Scan Whole Body CLINICAL DATA: Prostate cancer, PSA 73.13   IMPRESSION: Scattered degenerative type uptake of tracer. Decreased uptake at previous site RIGHT lateral aspect lower thoracic spine.  No new osseous metastatic lesions identified.   09/30/2020 Dr. Sebastian Ache NM Bone Scan Whole Body CLINICAL DATA: History of prostate cancer. Elevated PSA.   IMPRESSION: Punctate area of increased activity noted over the right lateral aspect of a lower thoracic vertebral body/medial aspect of adjacent rib. Further evaluation with thoracic spine MRI is suggested. No other abnormal bony activity identified.  Past/Anticipated interventions by urology, if any:     Past/Anticipated interventions by medical oncology, if any: NA  Weight changes, if any:  {:18581}  Bowel/Bladder complaints, if any: {:18581}   Nausea/Vomiting, if any: {:18581}  Pain issues, if any:  {:18581}  SAFETY ISSUES: Prior radiation? {:18581} Pacemaker/ICD? {:18581} Possible current pregnancy? Male Is the patient on methotrexate? No  Current Complaints / other details:

## 2022-11-02 ENCOUNTER — Ambulatory Visit
Admission: RE | Admit: 2022-11-02 | Discharge: 2022-11-02 | Disposition: A | Discharge status: Home / Self Care | Payer: Medicare PPO | Source: Ambulatory Visit | Attending: Medical Oncology | Admitting: Medical Oncology

## 2022-11-02 ENCOUNTER — Ambulatory Visit
Admission: RE | Admit: 2022-11-02 | Discharge: 2022-11-02 | Disposition: A | Discharge status: Home / Self Care | Payer: Medicare PPO | Source: Ambulatory Visit | Attending: Radiation Oncology | Admitting: Radiation Oncology

## 2022-11-02 ENCOUNTER — Telehealth: Payer: Self-pay | Admitting: *Deleted

## 2022-11-02 ENCOUNTER — Ambulatory Visit
Admission: RE | Admit: 2022-11-02 | Discharge: 2022-11-02 | Disposition: A | Discharge status: Home / Self Care | Payer: Medicare PPO | Source: Ambulatory Visit | Attending: Radiation Oncology

## 2022-11-02 VITALS — BP 129/56 | HR 69 | Temp 97.3°F | Resp 18 | Ht 72.0 in | Wt 181.5 lb

## 2022-11-02 DIAGNOSIS — Z87891 Personal history of nicotine dependence: Secondary | ICD-10-CM | POA: Insufficient documentation

## 2022-11-02 DIAGNOSIS — C7951 Secondary malignant neoplasm of bone: Secondary | ICD-10-CM | POA: Insufficient documentation

## 2022-11-02 DIAGNOSIS — Z79899 Other long term (current) drug therapy: Secondary | ICD-10-CM | POA: Insufficient documentation

## 2022-11-02 DIAGNOSIS — E785 Hyperlipidemia, unspecified: Secondary | ICD-10-CM | POA: Insufficient documentation

## 2022-11-02 DIAGNOSIS — C61 Malignant neoplasm of prostate: Secondary | ICD-10-CM | POA: Insufficient documentation

## 2022-11-02 DIAGNOSIS — Z192 Hormone resistant malignancy status: Secondary | ICD-10-CM | POA: Insufficient documentation

## 2022-11-02 DIAGNOSIS — Z923 Personal history of irradiation: Secondary | ICD-10-CM | POA: Insufficient documentation

## 2022-11-02 DIAGNOSIS — I10 Essential (primary) hypertension: Secondary | ICD-10-CM | POA: Insufficient documentation

## 2022-11-02 DIAGNOSIS — K219 Gastro-esophageal reflux disease without esophagitis: Secondary | ICD-10-CM | POA: Insufficient documentation

## 2022-11-02 DIAGNOSIS — Z7982 Long term (current) use of aspirin: Secondary | ICD-10-CM | POA: Insufficient documentation

## 2022-11-02 DIAGNOSIS — C787 Secondary malignant neoplasm of liver and intrahepatic bile duct: Secondary | ICD-10-CM | POA: Insufficient documentation

## 2022-11-02 LAB — CBC WITH DIFFERENTIAL (CANCER CENTER ONLY)
Abs Immature Granulocytes: 0.02 10*3/uL (ref 0.00–0.07)
Basophils Absolute: 0.1 10*3/uL (ref 0.0–0.1)
Basophils Relative: 1 %
Eosinophils Absolute: 0.4 10*3/uL (ref 0.0–0.5)
Eosinophils Relative: 4 %
HCT: 36.4 % — ABNORMAL LOW (ref 39.0–52.0)
Hemoglobin: 12 g/dL — ABNORMAL LOW (ref 13.0–17.0)
Immature Granulocytes: 0 %
Lymphocytes Relative: 13 %
Lymphs Abs: 1.1 10*3/uL (ref 0.7–4.0)
MCH: 32.7 pg (ref 26.0–34.0)
MCHC: 33 g/dL (ref 30.0–36.0)
MCV: 99.2 fL (ref 80.0–100.0)
Monocytes Absolute: 0.9 10*3/uL (ref 0.1–1.0)
Monocytes Relative: 10 %
Neutro Abs: 6.2 10*3/uL (ref 1.7–7.7)
Neutrophils Relative %: 72 %
Platelet Count: 190 10*3/uL (ref 150–400)
RBC: 3.67 MIL/uL — ABNORMAL LOW (ref 4.22–5.81)
RDW: 12.8 % (ref 11.5–15.5)
WBC Count: 8.7 10*3/uL (ref 4.0–10.5)
nRBC: 0 % (ref 0.0–0.2)

## 2022-11-02 NOTE — Telephone Encounter (Signed)
CALLED PATIENT TO ASK ABOUT COMING FOR LAB WORK TODAY @ 2 PM, SPOKE WITH PATIENT'S WIFE- GAYLE AND SHE STATED THAT THEY WOULD COME TODAY @ 2 PM FOR LABS

## 2022-11-02 NOTE — Progress Notes (Signed)
Radiation Oncology         [phone] ________________________________  Initial Outpatient Consultation  Name: Todd Riley MRN: [id number]  Date: 11/02/2022  DOB: Apr 14, 1928  CC:Pcp, No  Todd Riley., *   REFERRING PHYSICIAN: Loletta Parish., *  DIAGNOSIS: 89 y.o. gentleman with diffuse metastatic disease  No diagnosis found.  HISTORY OF PRESENT ILLNESS: Todd Riley is a 89 y.o. male with a diagnosis of recurrent metastatic prostate cancer. He was originally diagnosed with prostate cancer in 2006 *** and underwent a radical prostatectomy followed by salvage radiation by Dr. Aldean Ast. He was placed on Eligard soon after this.   In April of 2023 his PSA rose to 45, he was started on Eligard at that time. *** He is currently receiving Lupron 35 Q6 months and Abiraterone 1000/Pred 5. Most recent PET on 10/12/22 showed intense radiotracer activity associated with innumerable skeletal metastasis involving essentially every bone, intense radiotracer activity associated with hazy retroperitoneal tissue left of the aorta extending into the porta hepatis and peritoneal surfaces of the liver consistent with recurrent prostate cancer; intense radiotracer activity throughout the pancreas; and small radiotracer mediastinal lymph nodes.   The patient reviewed the biopsy results with his urologist and he has kindly been referred today for discussion of potential radiation treatment options.   PREVIOUS RADIATION THERAPY: {EXAM; YES/NO:19492::"No"}  PAST MEDICAL HISTORY:  Past Medical History:  Diagnosis Date   GERD (gastroesophageal reflux disease)    Hyperlipidemia    Hypertension    Prostate cancer (HCC)       PAST SURGICAL HISTORY: Past Surgical History:  Procedure Laterality Date   CARDIOVASCULAR STRESS TEST  06/07/2006   ef 56%, no ischemia   PROSTATE SURGERY     ROTATOR CUFF REPAIR     SHOULDER SURGERY      FAMILY HISTORY: No family history on  file.  SOCIAL HISTORY:  Social History   Socioeconomic History   Marital status: Married    Spouse name: Not on file   Number of children: Not on file   Years of education: Not on file   Highest education level: Not on file  Occupational History   Not on file  Tobacco Use   Smoking status: Former    Current packs/day: 0.00    Types: Cigarettes    Quit date: 06/16/1994    Years since quitting: 28.4   Smokeless tobacco: Never  Substance and Sexual Activity   Alcohol use: No   Drug use: No   Sexual activity: Not on file  Other Topics Concern   Not on file  Social History Narrative   Not on file   Social Determinants of Health   Financial Resource Strain: Low Risk  (08/26/2022)   Received from [facility]   Overall Financial Resource Strain (CARDIA)    Difficulty of Paying Living Expenses: Not very hard  Food Insecurity: No Food Insecurity (08/26/2022)   Received [facility]   Hunger Vital Sign    Worried About Running Out of Food in the Last Year: Never true    Ran Out of Food in the Last Year: Never true  Transportation Needs: No Transportation Needs (08/26/2022)   Received from [facility] - Transportation    Lack of Transportation (Medical): No    Lack of Transportation (Non-Medical): No  Physical Activity: Unknown (08/26/2022)   Received from [facility]   Exercise Vital Sign    Days of Exercise per Week: 0  days    Minutes of Exercise per Session: Not on file  Stress: No Stress Concern Present (08/26/2022)   Received from [facility] - Occupational Stress Questionnaire    Feeling of Stress : Only a little  Social Connections: Moderately Integrated (08/26/2022)   Received from [facility]   Social Network    How would you rate your social network (family, work, friends)?: Adequate participation with social networks  Intimate Partner Violence: Not At Risk (08/26/2022)   Received from [facility]    HITS    Over the last 12 months how often did your partner physically hurt you?: 1    Over the last 12 months how often did your partner insult you or talk down to you?: 1    Over the last 12 months how often did your partner threaten you with physical harm?: 1    Over the last 12 months how often did your partner scream or curse at you?: 1    ALLERGIES: Sulfa antibiotics  MEDICATIONS:  Current Outpatient Medications  Medication Sig Dispense Refill   aspirin 81 MG tablet Take 81 mg by mouth daily.     calcium carbonate (OS-CAL) 600 MG TABS Take 600 mg by mouth 2 (two) times daily with a meal.     denosumab (PROLIA) 60 MG/ML SOSY injection Inject 60 mg into the skin every 6 (six) months.     HYDROcodone-acetaminophen (NORCO) 5-325 MG tablet Take 1 tablet by mouth every 6 (six) hours as needed for moderate pain. 30 tablet 0   leuprolide (LUPRON) 30 MG injection Inject 30 mg into the muscle every 4 (four) months.     Multiple Vitamin (MULTIVITAMIN) tablet Take 1 tablet by mouth daily.     multivitamin-lutein (OCUVITE-LUTEIN) CAPS Take 1 capsule by mouth daily.     NUBEQA 300 MG TABS      Omega-3 Fatty Acids (FISH OIL) 1200 MG CAPS Take 1 capsule by mouth 2 (two) times daily.     omeprazole (PRILOSEC) 20 MG capsule Take 1 capsule (20 mg total) by mouth daily. 90 capsule 3   No current facility-administered medications for this visit.    REVIEW OF SYSTEMS:  On review of systems, the patient endorses left sided, lower back pain. He denies any other pain. This is positional and has been present for years. He states he has been worked up for this. He endorses urinary frequency. He denies issues with bowel movements, nausea, or vomiting, or changes in his weight.     PHYSICAL EXAM:  Wt Readings from Last 3 Encounters:  04/20/19 184 lb (83.5 kg)  11/17/18 184 lb (83.5 kg)  11/01/18 191 lb (86.6 kg)   Temp Readings from Last 3 Encounters:  04/20/19 (!) 96.8 F (36 C) (Other (Comment))   11/17/18 97.6 F (36.4 C) (Oral)  11/01/18 98.2 F (36.8 C) (Oral)   BP Readings from Last 3 Encounters:  04/20/19 (!) 156/80  11/17/18 (!) 150/68  11/01/18 126/60   Pulse Readings from Last 3 Encounters:  04/20/19 78  11/17/18 78  11/01/18 70    /10  In general this is a well appearing male in no acute distress. He's alert and oriented x4 and appropriate throughout the examination. Cardiopulmonary assessment is negative for acute distress, and he exhibits normal effort.     KPS = 100  100 - Normal; no complaints; no evidence of disease. 90   - Able to carry on normal activity;  minor signs or symptoms of disease. 80   - Normal activity with effort; some signs or symptoms of disease. 65   - Cares for self; unable to carry on normal activity or to do active work. 60   - Requires occasional assistance, but is able to care for most of his personal needs. 50   - Requires considerable assistance and frequent medical care. 40   - Disabled; requires special care and assistance. 30   - Severely disabled; hospital admission is indicated although death not imminent. 20   - Very sick; hospital admission necessary; active supportive treatment necessary. 10   - Moribund; fatal processes progressing rapidly. 0     - Dead  Karnofsky DA, Abelmann WH, Craver LS and Burchenal JH [phone]) The use of the nitrogen mustards in the palliative treatment of carcinoma: with particular reference to bronchogenic carcinoma Cancer 1 634-56  LABORATORY DATA:  Lab Results  Component Value Date   WBC 17.2 (H) 02/23/2018   HGB 13.5 02/23/2018   HCT 38.7 02/23/2018   MCV 89.2 02/23/2018   PLT 227 02/23/2018   Lab Results  Component Value Date   NA 138 02/23/2018   K 4.2 02/23/2018   CL 99 02/23/2018   CO2 29 02/23/2018   Lab Results  Component Value Date   ALT 26 01/21/2017   AST 25 01/21/2017   ALKPHOS 43 12/30/2015   BILITOT 0.6 01/21/2017     RADIOGRAPHY: NM PET (PSMA) SKULL TO MID  THIGH  Result Date: 10/12/2022 CLINICAL DATA:  Prostate carcinoma with biochemical recurrence. Radical prostatectomy 30 years prior. Rising PSA equal 195. EXAM: NUCLEAR MEDICINE PET SKULL BASE TO THIGH TECHNIQUE: 8.7 mCi F18 Piflufolastat (Pylarify) was injected intravenously. Full-ring PET imaging was performed from the skull base to thigh after the radiotracer. CT data was obtained and used for attenuation correction and anatomic localization. COMPARISON:  MDP bone scan 03/23/2022, CT 04/08/2022 FINDINGS: NECK No radiotracer activity in neck lymph nodes. Incidental CT finding: None. CHEST Small radiotracer avid mediastinal lymph nodes. Lymph nodes are not pathologically enlarged by size but intensely radiotracer avid. Incidental CT finding: No suspicious pulmonary nodules ABDOMEN/PELVIS Prostate: Post prostatectomy. No abnormal radiotracer activity in the prostatectomy bed. Lymph nodes: No discrete radiotracer avid pelvic lymph nodes. There is intense radiotracer activity associated hazy retroperitoneal tissue LEFT the aorta from the level of the bifurcation up to the pancreas with intense radiotracer activity but again poorly defined soft tissue. Activity LEFT aorta SUV max equal 49.4 This intensely hypermetabolic tissue on PET imaging extends into the porta hepatis and along the vessels. Intense activity associated entirety of the pancreas with SUV max equal 36 Liver: No evidence of liver parenchymal metastasis. Extensive peritoneal malignancy within the porta hepatis extending along the liver vascular tracks Incidental CT finding: None. SKELETON There are innumerable small intensely radiotracer avid skeletal lesions involving the axillary and appendicular skeleton. Estimation of greater than 300 lesions. For example 8 lesions on CT slice 174 through the pelvis. Individual lesion with SUV max equal 51. Essentially every bone is involved by these small radiotracer avid lesions. IMPRESSION: 1. Intense  radiotracer activity associated with innumerable small skeletal metastasis involving essentially every bone. Lesions too numerous to count. 2. Intense radiotracer activity associated with hazy retroperitoneal tissue LEFT of the aorta extending into the porta hepatis and peritoneal surfaces of the liver consistent with recurrent prostate cancer. 3. Intense radiotracer activity throughout the pancreas is favored prostate cancer metastasis. 4. Small radiotracer avid mediastinal lymph nodes  consistent with mediastinal nodal metastasis. 5. No evidence of local recurrence in the prostatectomy bed. Electronically Signed   By: Genevive Bi M.D.   On: 10/12/2022 13:08      IMPRESSION/PLAN: 1. 89 y.o. gentleman with Stage T*** adenocarcinoma of the prostate with Gleason Score of ***+***, and PSA of ***.  Today, we talked to the patient and family about the findings and workup thus far. We discussed the natural history of metastatic prostate cancer and general treatment, highlighting the role of Xogifo infusions in the management. We focused on the details of logistics and delivery. The recommendation is to proceed with monthly infusions of Xofigo x6. We will monitor labs prior to each infusion to ensure it is safe to proceed with each treatment. We reviewed the anticipated acute and late sequelae associated with Xofigo in this setting. The patient was encouraged to ask questions that were answered to his/her satisfaction.  At the end of our conversation, the patient elects to proceed with Xofigo infusions. We will share this information with Dr. Berneice Heinrich. We will proceed with treatment planning accordingly and anticipate beginning treatment in the near future.  At the conclusion of our conversation, the patient is interested in moving forward with ***. Xofigo.   We personally spent 60 minutes in this encounter including chart review, reviewing radiological studies, meeting face-to-face with the patient, entering  orders and completing documentation.    Joyice Faster, PA-C    Margaretmary Dys, MD  [facility]  Radiation Oncology Direct Dial: [phone]  Fax: [phone] [email or web address]  Skype  LinkedIn

## 2022-11-03 ENCOUNTER — Ambulatory Visit: Payer: Medicare PPO | Admitting: Radiation Oncology

## 2022-11-03 DIAGNOSIS — C7951 Secondary malignant neoplasm of bone: Secondary | ICD-10-CM | POA: Insufficient documentation

## 2022-11-03 DIAGNOSIS — C61 Malignant neoplasm of prostate: Secondary | ICD-10-CM | POA: Insufficient documentation

## 2022-11-04 ENCOUNTER — Other Ambulatory Visit: Payer: Self-pay | Admitting: Urology

## 2022-11-04 ENCOUNTER — Other Ambulatory Visit: Payer: Self-pay | Admitting: Radiology

## 2022-11-04 DIAGNOSIS — C7951 Secondary malignant neoplasm of bone: Secondary | ICD-10-CM

## 2022-11-04 DIAGNOSIS — C61 Malignant neoplasm of prostate: Secondary | ICD-10-CM

## 2022-11-04 NOTE — Addendum Note (Signed)
Encounter addended by: Margaretmary Dys, MD on: 11/04/2022 8:27 AM  Actions taken: Clinical Note Signed

## 2022-11-04 NOTE — Progress Notes (Signed)
Radiation Oncology         [phone]) [phone] ________________________________  Name: Todd Riley MRN: [id number]  Date: 11/02/2022  DOB: 12-28-1928  Xofigo Treatment Planning Note:  Diagnosis:  Castration resistant prostate cancer with painful bone involvement  Narrative: Mr.Todd Riley is a patient who has been diagnosed with castration resistant prostate cancer with painful bone involvement.  His most recent blood counts show that he remains a good candidate to proceed with Ra-223.  The patient is going to receive Xofigo for his treatment.   Radiation Treatment Planning:  The prescribed radiation activity will be 50 kBq per kg per infusions. The plan is to offer a total of 6 IV administrations of this agent, assuming the blood counts are adequate prior to each administration, with each infusion done at 4 week intervals.  This will be done as an IV administration in the nuclear medicine department, with care to undertake all radiation protection precautions as recommended.   ________________________________  Artist Pais. Kathrynn Running, M.D.

## 2022-11-05 ENCOUNTER — Telehealth: Payer: Self-pay | Admitting: *Deleted

## 2022-11-05 NOTE — Telephone Encounter (Signed)
Called patient to inform that the insurance person is working on this to get it pre-auth by his insurance, spoke with patient's wife- Inocencio Homes and she is aware of this

## 2022-11-06 ENCOUNTER — Other Ambulatory Visit: Payer: Self-pay

## 2022-11-06 DIAGNOSIS — C7951 Secondary malignant neoplasm of bone: Secondary | ICD-10-CM

## 2022-11-09 ENCOUNTER — Telehealth: Payer: Self-pay | Admitting: *Deleted

## 2022-11-09 NOTE — Telephone Encounter (Signed)
CALLED PATIENT TO INFORM OF LABS AND WEIGHT FOR 11-13-22 IN [place] AND HIS XOFIGO INJ ON 11-20-22 - ARRIVAL TIME- 11:45 AM @ [facility], SPOKE WITH PATIENT'S WIFE - GAIL AND SHE IS AWARE OF THESE APPTS. AND THE INSTRUCTIONS

## 2022-11-12 ENCOUNTER — Telehealth: Payer: Self-pay | Admitting: *Deleted

## 2022-11-12 NOTE — Telephone Encounter (Signed)
Called patient to remind of lab and weight appt. for 11-13-22, spoke with patient and he is aware of these appts.

## 2022-11-19 ENCOUNTER — Telehealth: Payer: Self-pay | Admitting: *Deleted

## 2022-11-19 NOTE — Telephone Encounter (Signed)
CALLED PATIENT TO REMIND OF INJ. FOR 11-20-22- ARRIVAL TIME- 11:45 AM @ [facility], LVM FOR A RETURN CALL

## 2022-11-20 ENCOUNTER — Ambulatory Visit (HOSPITAL_COMMUNITY)
Admission: RE | Admit: 2022-11-20 | Discharge: 2022-11-20 | Disposition: A | Discharge status: Home / Self Care | Payer: Medicare PPO | Source: Ambulatory Visit | Attending: Radiation Oncology | Admitting: Radiation Oncology

## 2022-11-20 DIAGNOSIS — C7951 Secondary malignant neoplasm of bone: Secondary | ICD-10-CM | POA: Insufficient documentation

## 2022-11-20 DIAGNOSIS — C61 Malignant neoplasm of prostate: Secondary | ICD-10-CM

## 2022-11-20 MED ORDER — RADIUM RA 223 DICHLORIDE 30 MCCI/ML IV SOLN
114.7000 | Freq: Once | INTRAVENOUS | Status: AC | PRN
  Administered 2022-11-20: 114.7 via INTRAVENOUS

## 2022-11-30 ENCOUNTER — Other Ambulatory Visit: Payer: Self-pay

## 2022-11-30 DIAGNOSIS — C7951 Secondary malignant neoplasm of bone: Secondary | ICD-10-CM

## 2022-12-01 ENCOUNTER — Telehealth: Payer: Self-pay | Admitting: *Deleted

## 2022-12-01 NOTE — Telephone Encounter (Signed)
CALLED PATIENT TO INFORM OF LAB AND WEIGHT APPT. FOR 12-17-22 AND HIS XOFIGO INJ. FOR 12-24-22- ARRIVAL TIME- 11:45 AM @ [facility], SPOKE WITH PATIENT'S WIFE GAYLE AND SHE IS AWARE OF THESE APPTS.

## 2022-12-01 NOTE — Progress Notes (Signed)
Radiation Oncology         [phone] ________________________________  Name: RIGGIN MORIMOTO MRN: [id number]  Date: 11/20/2022  DOB: February 05, 1929  Radium-223 Infusion Note  Diagnosis:  Castration resistant prostate cancer with painful bone involvement  Current Infusion:    1  Planned Infusions:  6  Narrative: Mr. KASHAN RIVETT presented to nuclear medicine for treatment. His most recent blood counts were reviewed.  He remains a good candidate to proceed with Ra-223.  The patient was situated in an infusion suite with a contact barrier placed under his arm. Intravenous access was established, using sterile technique, and a normal saline infusion from a syringe was started.  Micro-dosimetry:  The prescribed radiation activity was assayed and confirmed to be within specified tolerance.  Special Treatment Procedure - Infusion:  The nuclear medicine technologist and I personally verified the dose activity to be delivered as specified in the written directive, and verified the patient identification via 2 separate methods.  The syringe containing the dose was attached to an intravenous access and the dose delivered over a minute. No complications were noted.  The total administered dose was 118.7 microcuries.   A saline flush of the line and the syringe that contained the isotope was then performed.  The residual radioactivity in the syringe was 4.0 microcuries, so the actual infused isotope activity was 114.7 microcuries.   Pressure was applied to the venipuncture site, and a compression bandage placed.   Radiation Safety personnel were present to perform the discharge survey, as detailed on their documentation.   After a short period of observation, the patient had his IV removed.  Impression:  The patient tolerated his infusion relatively well.  Plan:  The patient will return in one month for ongoing care.    ________________________________  Artist Pais. Kathrynn Running, M.D.

## 2022-12-15 ENCOUNTER — Telehealth: Payer: Self-pay | Admitting: *Deleted

## 2022-12-15 NOTE — Telephone Encounter (Signed)
CALLED PATIENT TO ASK ABOUT WHEN HE WILL BE GETTING HIS LAB ON 12-17-22, SPOKE WITH PATIENT AND HE STATES THAT HE WILL GO EARLY 12-17-22 @ 7:30 AM IN [place]

## 2022-12-23 ENCOUNTER — Telehealth: Payer: Self-pay | Admitting: *Deleted

## 2022-12-23 NOTE — Telephone Encounter (Signed)
CALLED PATIENT TO REMIND OF XOFIGO INJ. ON 12-24-22- ARRIVAL TIME- 11:45 AM @ [facility], LVM FOR A RETURN CALL

## 2022-12-24 ENCOUNTER — Ambulatory Visit (HOSPITAL_COMMUNITY)
Admission: RE | Admit: 2022-12-24 | Discharge: 2022-12-24 | Disposition: A | Discharge status: Home / Self Care | Payer: Medicare PPO | Source: Ambulatory Visit | Attending: Urology | Admitting: Urology

## 2022-12-24 DIAGNOSIS — C7951 Secondary malignant neoplasm of bone: Secondary | ICD-10-CM | POA: Diagnosis present

## 2022-12-24 DIAGNOSIS — C61 Malignant neoplasm of prostate: Secondary | ICD-10-CM | POA: Insufficient documentation

## 2022-12-24 MED ORDER — RADIUM RA 223 DICHLORIDE 30 MCCI/ML IV SOLN
117.0300 | Freq: Once | INTRAVENOUS | Status: AC | PRN
  Administered 2022-12-24: 117.03 via INTRAVENOUS

## 2022-12-24 NOTE — Progress Notes (Signed)
  Radiation Oncology         [phone] ________________________________  Name: BOLTON CANUPP MRN: [id number]  Date: 12/24/2022  DOB: May 29, 1928  Radium-223 Infusion Note  Diagnosis:  Castration resistant prostate cancer with painful bone involvement  Current Infusion:    2  Planned Infusions:  6  Narrative: Mr. KIMONI PAGLIARULO presented to nuclear medicine for treatment. His most recent blood counts were reviewed.  He remains a good candidate to proceed with Ra-223.  The patient was situated in an infusion suite with a contact barrier placed under his arm. Intravenous access was established, using sterile technique, and a normal saline infusion from a syringe was started.  Micro-dosimetry:  The prescribed radiation activity was assayed and confirmed to be within specified tolerance.  Special Treatment Procedure - Infusion:  The nuclear medicine technologist and I personally verified the dose activity to be delivered as specified in the written directive, and verified the patient identification via 2 separate methods.  The syringe containing the dose was attached to an intravenous access and the dose delivered over a minute. No complications were noted.  The total administered dose was 121.3 microcuries.   A saline flush of the line and the syringe that contained the isotope was then performed.  The residual radioactivity in the syringe was 4.27 microcuries, so the actual infused isotope activity was 117.03 microcuries.   Pressure was applied to the venipuncture site, and a compression bandage placed.   Radiation Safety personnel were present to perform the discharge survey, as detailed on their documentation.   After a short period of observation, the patient had his IV removed.  Impression:  The patient tolerated his infusion relatively well.  Plan:  The patient will return in one month for ongoing care.    ________________________________  Artist Pais. Kathrynn Running, M.D.

## 2022-12-31 ENCOUNTER — Telehealth: Payer: Self-pay | Admitting: *Deleted

## 2022-12-31 ENCOUNTER — Other Ambulatory Visit: Payer: Self-pay

## 2022-12-31 DIAGNOSIS — C7951 Secondary malignant neoplasm of bone: Secondary | ICD-10-CM

## 2022-12-31 NOTE — Telephone Encounter (Signed)
CALLED PATIENT TO INFORM OF LAB AND WEIGHT APPT. ON 01-19-23 AND HIS XOFIGO INJ. ON  01-26-23- ARRIVAL TIME- 11:45 AM @ [facility], SPOKE WITH PATIENT'S WIFE- GAYLE AND SHE IS AWARE OF THESE APPTS. AND THE INSTRUCTIONS

## 2023-01-18 ENCOUNTER — Telehealth: Payer: Self-pay | Admitting: *Deleted

## 2023-01-18 NOTE — Telephone Encounter (Signed)
Called patient to remind of lab and weight for 01-19-23, lvm for a return call

## 2023-01-25 ENCOUNTER — Telehealth: Payer: Self-pay | Admitting: *Deleted

## 2023-01-25 NOTE — Telephone Encounter (Signed)
Called patient to remind of Xofigo inj. for 01-26-23- arrival time- 11:45 am @ [facility].

## 2023-01-26 ENCOUNTER — Ambulatory Visit (HOSPITAL_COMMUNITY)
Admission: RE | Admit: 2023-01-26 | Discharge: 2023-01-26 | Disposition: A | Discharge status: Home / Self Care | Payer: Medicare PPO | Source: Ambulatory Visit | Attending: Radiation Oncology | Admitting: Radiation Oncology

## 2023-01-26 DIAGNOSIS — C7951 Secondary malignant neoplasm of bone: Secondary | ICD-10-CM | POA: Diagnosis present

## 2023-01-26 DIAGNOSIS — C61 Malignant neoplasm of prostate: Secondary | ICD-10-CM

## 2023-01-26 MED ORDER — RADIUM RA 223 DICHLORIDE 30 MCCI/ML IV SOLN
114.7000 | Freq: Once | INTRAVENOUS | Status: DC | PRN
Start: 2023-01-26 — End: 2023-02-01

## 2023-02-02 ENCOUNTER — Telehealth: Payer: Self-pay | Admitting: *Deleted

## 2023-02-02 NOTE — Telephone Encounter (Signed)
RETURNED PATIENT'S PHONE CALL, SPOKE WITH PATIENT'S WIFE- GAYLE

## 2023-02-02 NOTE — Progress Notes (Signed)
  Radiation Oncology         [phone] ________________________________  Name: Todd Riley MRN: [id number]  Date: 01/26/2023  DOB: 1928/12/15  Radium-223 Infusion Note  Diagnosis:  Castration resistant prostate cancer with painful bone involvement  Current Infusion:    3  Planned Infusions:  6  Narrative: Todd Riley presented to nuclear medicine for treatment. His most recent blood counts were reviewed.  He remains a good candidate to proceed with Ra-223.  The patient was situated in an infusion suite with a contact barrier placed under his arm. Intravenous access was established, using sterile technique, and a normal saline infusion from a syringe was started.  Micro-dosimetry:  The prescribed radiation activity was assayed and confirmed to be within specified tolerance.  Special Treatment Procedure - Infusion:  The nuclear medicine technologist and I personally verified the dose activity to be delivered as specified in the written directive, and verified the patient identification via 2 separate methods.  The syringe containing the dose was attached to an intravenous access and the dose delivered over a minute. No complications were noted.  The total administered dose was 119.0 microcuries.   A saline flush of the line and the syringe that contained the isotope was then performed.  The residual radioactivity in the syringe was 4.2 microcuries, so the actual infused isotope activity was 114.8 microcuries.   Pressure was applied to the venipuncture site, and a compression bandage placed.   Radiation Safety personnel were present to perform the discharge survey, as detailed on their documentation.   After a short period of observation, the patient had his IV removed.  Impression:  The patient tolerated his infusion relatively well.  Plan:  The patient will return in one month for ongoing care.    ________________________________  Artist Pais. Kathrynn Running, M.D.

## 2023-02-05 ENCOUNTER — Telehealth: Payer: Self-pay | Admitting: *Deleted

## 2023-02-05 ENCOUNTER — Other Ambulatory Visit (HOSPITAL_COMMUNITY): Payer: Self-pay | Admitting: Radiation Oncology

## 2023-02-05 DIAGNOSIS — Z192 Hormone resistant malignancy status: Secondary | ICD-10-CM

## 2023-02-05 NOTE — Telephone Encounter (Signed)
CALLED PATIENT TO INFORM OF LAB AND WEIGHT FOR 02-19-23 AND HIS XOFIGO INJ. FOR 02-26-23- ARRIVAL TIME- 11:45 AM @ [facility], SPOKE WITH PATIENT'S WIFE- GAYLE Perella AND SHE IS AWARE OF THESE APPTS. AND THE INSTRUCTIONS

## 2023-02-09 ENCOUNTER — Other Ambulatory Visit: Payer: Self-pay

## 2023-02-09 DIAGNOSIS — C7951 Secondary malignant neoplasm of bone: Secondary | ICD-10-CM

## 2023-02-18 ENCOUNTER — Telehealth: Payer: Self-pay | Admitting: *Deleted

## 2023-02-18 NOTE — Telephone Encounter (Signed)
 CALLED PATIENT TO REMIND OF LAB AND WEIGHT APPT. FOR 02-19-23, SPOKE WITH PATIENT'S WIFE- MS. Vanderhoff AND SHE IS AWARE OF THIS APPT.

## 2023-02-25 ENCOUNTER — Telehealth: Payer: Self-pay | Admitting: *Deleted

## 2023-02-25 NOTE — Telephone Encounter (Signed)
 CALLED PATIENT TO REMIND OF XOFIGO INJ. FOR 02-26-23- ARRIVAL TIME- 11:45 AM @ [facility], SPOKE WITH PATIENT'S WIFE- GAYLE AND SHE IS AWARE OF THIS INJ.

## 2023-02-26 ENCOUNTER — Encounter (HOSPITAL_COMMUNITY)
Admission: RE | Admit: 2023-02-26 | Discharge: 2023-02-26 | Disposition: A | Discharge status: Home / Self Care | Payer: Medicare PPO | Source: Ambulatory Visit | Attending: Radiation Oncology | Admitting: Radiation Oncology

## 2023-02-26 DIAGNOSIS — C61 Malignant neoplasm of prostate: Secondary | ICD-10-CM | POA: Diagnosis present

## 2023-02-26 DIAGNOSIS — C7951 Secondary malignant neoplasm of bone: Secondary | ICD-10-CM | POA: Insufficient documentation

## 2023-02-26 DIAGNOSIS — Z192 Hormone resistant malignancy status: Secondary | ICD-10-CM | POA: Insufficient documentation

## 2023-02-26 MED ORDER — RADIUM RA 223 DICHLORIDE 30 MCCI/ML IV SOLN
117.5000 | Freq: Once | INTRAVENOUS | Status: AC
  Administered 2023-02-26: 117.5 via INTRAVENOUS

## 2023-03-02 NOTE — Progress Notes (Signed)
  Radiation Oncology         [phone] ________________________________  Name: YAHIA BOTTGER MRN: [id number]  Date: 02/26/2023  DOB: [birth year]  Radium-223 Infusion Note  Diagnosis:  Castration resistant prostate cancer with painful bone involvement  Current Infusion:    4  Planned Infusions:  6  Narrative: Mr. ANOOP HEMMER presented to nuclear medicine for treatment. His most recent blood counts were reviewed.  He remains a good candidate to proceed with Ra-223.  The patient was situated in an infusion suite with a contact barrier placed under his arm. Intravenous access was established, using sterile technique, and a normal saline infusion from a syringe was started.  Micro-dosimetry:  The prescribed radiation activity was assayed and confirmed to be within specified tolerance.  Special Treatment Procedure - Infusion:  The nuclear medicine technologist and I personally verified the dose activity to be delivered as specified in the written directive, and verified the patient identification via 2 separate methods.  The syringe containing the dose was attached to an intravenous access and the dose delivered over a minute. No complications were noted.  The total administered dose was 121.5 microcuries.   A saline flush of the line and the syringe that contained the isotope was then performed.  The residual radioactivity in the syringe was 4.0 microcuries, so the actual infused isotope activity was 117.5 microcuries.   Pressure was applied to the venipuncture site, and a compression bandage placed.   Radiation Safety personnel were present to perform the discharge survey, as detailed on their documentation.   After a short period of observation, the patient had his IV removed.  Impression:  The patient tolerated his infusion relatively well.  Plan:  The patient will return in one month for ongoing care.    ________________________________  Donnice LABOR. Patrcia, M.D.

## 2023-03-10 ENCOUNTER — Telehealth: Payer: Self-pay | Admitting: *Deleted

## 2023-03-10 ENCOUNTER — Other Ambulatory Visit (HOSPITAL_COMMUNITY): Payer: Self-pay | Admitting: Radiation Oncology

## 2023-03-10 ENCOUNTER — Other Ambulatory Visit: Payer: Self-pay

## 2023-03-10 DIAGNOSIS — C61 Malignant neoplasm of prostate: Secondary | ICD-10-CM

## 2023-03-10 DIAGNOSIS — C7951 Secondary malignant neoplasm of bone: Secondary | ICD-10-CM

## 2023-03-10 NOTE — Telephone Encounter (Signed)
CALLED PATIENT TO INFORM OF LABS AND WEIGHT ON 03-25-23 AND HIS XOFIGO INJ. ON 04-01-23 - ARRIVAL TIME- 11:45 AM @ [facility], LVM FOR A RETURN CALL

## 2023-03-24 ENCOUNTER — Telehealth: Payer: Self-pay | Admitting: *Deleted

## 2023-03-24 NOTE — Telephone Encounter (Signed)
 Called patient to remind of lab and weight appt. for 03-25-23, lvm for a return call

## 2023-03-31 ENCOUNTER — Telehealth: Payer: Self-pay | Admitting: *Deleted

## 2023-03-31 NOTE — Telephone Encounter (Signed)
CALLED PATIENT TO REMIND OF XOFIGO INJ. FOR 04-01-23- ARRIVAL TIME- 11:45 AM @ [facility], LVM FOR A RETURN CALL

## 2023-04-01 ENCOUNTER — Encounter (HOSPITAL_COMMUNITY)
Admission: RE | Admit: 2023-04-01 | Discharge: 2023-04-01 | Disposition: A | Discharge status: Home / Self Care | Payer: Medicare PPO | Source: Ambulatory Visit | Attending: Radiation Oncology | Admitting: Radiation Oncology

## 2023-04-01 DIAGNOSIS — C61 Malignant neoplasm of prostate: Secondary | ICD-10-CM | POA: Diagnosis present

## 2023-04-01 DIAGNOSIS — C7951 Secondary malignant neoplasm of bone: Secondary | ICD-10-CM

## 2023-04-01 MED ORDER — RADIUM RA 223 DICHLORIDE 30 MCCI/ML IV SOLN
123.1700 | Freq: Once | INTRAVENOUS | Status: AC
  Administered 2023-04-01: 123.17 via INTRAVENOUS

## 2023-04-08 ENCOUNTER — Telehealth: Payer: Self-pay | Admitting: *Deleted

## 2023-04-08 ENCOUNTER — Other Ambulatory Visit: Payer: Self-pay

## 2023-04-08 DIAGNOSIS — C7951 Secondary malignant neoplasm of bone: Secondary | ICD-10-CM

## 2023-04-08 NOTE — Telephone Encounter (Signed)
CALLED PATIENT TO INFORM OF LAB AND WEIGHT APPT. FOR 04-22-23 AND HIS XOFIGO INJ. ON 04-29-23- ARRIVAL TIME- 11:45 AM @ [facility], SPOKE WITH PATIENT'S WIFE- GAYLE AND SHE IS AWARE OF THESE APPTS.

## 2023-04-13 NOTE — Progress Notes (Signed)
  Radiation Oncology         [phone] ________________________________  Name: Todd Riley MRN: [id number]  Date: 04/01/2023  DOB: [birth year]  Radium-223 Infusion Note  Diagnosis:  Castration resistant prostate cancer with painful bone involvement  Current Infusion:    5  Planned Infusions:  6  Narrative: Todd Riley presented to nuclear medicine for treatment. His most recent blood counts were reviewed.  He remains a good candidate to proceed with Ra-223.  The patient was situated in an infusion suite with a contact barrier placed under his arm. Intravenous access was established, using sterile technique, and a normal saline infusion from a syringe was started.  Micro-dosimetry:  The prescribed radiation activity was assayed and confirmed to be within specified tolerance.  Special Treatment Procedure - Infusion:  The nuclear medicine technologist and I personally verified the dose activity to be delivered as specified in the written directive, and verified the patient identification via 2 separate methods.  The syringe containing the dose was attached to an intravenous access and the dose delivered over a minute. No complications were noted.  The total administered dose was 127.0 microcuries.   A saline flush of the line and the syringe that contained the isotope was then performed.  The residual radioactivity in the syringe was 3.8 microcuries, so the actual infused isotope activity was 123.3 microcuries.   Pressure was applied to the venipuncture site, and a compression bandage placed.   Radiation Safety personnel were present to perform the discharge survey, as detailed on their documentation.   After a short period of observation, the patient had his IV removed.  Impression:  The patient tolerated his infusion relatively well.  Plan:  The patient will return in one month for ongoing care.    ________________________________  Artist Pais. Kathrynn Running, M.D.

## 2023-04-21 ENCOUNTER — Telehealth: Payer: Self-pay | Admitting: *Deleted

## 2023-04-21 NOTE — Telephone Encounter (Signed)
 CALLED PATIENT TO REMIND OF LABS AND WEIGHT FOR 04-22-23, SPOKE WITH PATIENT'S GAYLE AND SHE IS AWARE OF THIS APPT.

## 2023-04-28 ENCOUNTER — Telehealth: Payer: Self-pay | Admitting: *Deleted

## 2023-04-28 NOTE — Telephone Encounter (Signed)
 CALLED PATIENT TO REMIND OF XOFIGO INJ. FOR 04-29-23- ARRIVAL TIME- 11:45 AM @ [facility], LVM FOR A RETURN CALL

## 2023-04-29 ENCOUNTER — Encounter (HOSPITAL_COMMUNITY)
Admission: RE | Admit: 2023-04-29 | Discharge: 2023-04-29 | Disposition: A | Discharge status: Home / Self Care | Payer: Medicare PPO | Source: Ambulatory Visit | Attending: Urology | Admitting: Urology

## 2023-04-29 DIAGNOSIS — C7951 Secondary malignant neoplasm of bone: Secondary | ICD-10-CM | POA: Insufficient documentation

## 2023-04-29 MED ORDER — RADIUM RA 223 DICHLORIDE 30 MCCI/ML IV SOLN
121.2400 | Freq: Once | INTRAVENOUS | Status: AC
Start: 2023-04-29 — End: 2023-04-29
  Administered 2023-04-29: 121.24 via INTRAVENOUS

## 2023-08-23 NOTE — Progress Notes (Signed)
  Radiation Oncology         [phone] ________________________________  Name: Todd Riley MRN: [id number]  Date: 04/29/2023  DOB: [birth year]  Radium-223 Infusion Note  Diagnosis:  Castration resistant prostate cancer with painful bone involvement  Current Infusion:    6  Planned Infusions:  6  Narrative: Todd Riley presented to nuclear medicine for treatment. His most recent blood counts were reviewed.  He remains a good candidate to proceed with Ra-223.  The patient was situated in an infusion suite with a contact barrier placed under his arm. Intravenous access was established, using sterile technique, and a normal saline infusion from a syringe was started.  Micro-dosimetry:  The prescribed radiation activity was assayed and confirmed to be within specified tolerance.  Special Treatment Procedure - Infusion:  The nuclear medicine technologist and I personally verified the dose activity to be delivered as specified in the written directive, and verified the patient identification via 2 separate methods.  The syringe containing the dose was attached to an intravenous access and the dose delivered over a minute. No complications were noted.  The total administered dose was 126.0 microcuries.   A saline flush of the line and the syringe that contained the isotope was then performed.  The residual radioactivity in the syringe was 4.87 microcuries, so the actual infused isotope activity was 121.13 microcuries.   Pressure was applied to the venipuncture site, and a compression bandage placed.   Radiation Safety personnel were present to perform the discharge survey, as detailed on their documentation.   After a short period of observation, the patient had his IV removed.  Impression:  The patient tolerated his infusion relatively well.  Plan:  The patient will return in one month for ongoing care.    ________________________________  Donnice LABOR. Patrcia, M.D.

## 2023-08-23 NOTE — Addendum Note (Signed)
 Encounter addended by: Patrcia Cough, MD on: 08/23/2023 2:51 PM  Actions taken: Clinical Note Signed

## 2023-09-21 ENCOUNTER — Other Ambulatory Visit (HOSPITAL_COMMUNITY): Payer: Self-pay | Admitting: Urology

## 2023-09-21 DIAGNOSIS — C7951 Secondary malignant neoplasm of bone: Secondary | ICD-10-CM

## 2023-09-21 DIAGNOSIS — C61 Malignant neoplasm of prostate: Secondary | ICD-10-CM

## 2023-09-28 ENCOUNTER — Encounter (HOSPITAL_COMMUNITY)
Admission: RE | Admit: 2023-09-28 | Discharge: 2023-09-28 | Disposition: A | Discharge status: Home / Self Care | Source: Ambulatory Visit | Attending: Urology | Admitting: Urology

## 2023-09-28 DIAGNOSIS — C7951 Secondary malignant neoplasm of bone: Secondary | ICD-10-CM | POA: Diagnosis present

## 2023-09-28 DIAGNOSIS — C61 Malignant neoplasm of prostate: Secondary | ICD-10-CM | POA: Insufficient documentation

## 2023-09-28 DIAGNOSIS — C7952 Secondary malignant neoplasm of bone marrow: Secondary | ICD-10-CM | POA: Insufficient documentation

## 2023-09-28 MED ORDER — FLOTUFOLASTAT F 18 GALLIUM 296-5846 MBQ/ML IV SOLN
7.8000 | Freq: Once | INTRAVENOUS | Status: AC
  Administered 2023-09-28 (×2): 7.8 via INTRAVENOUS
  Filled 2023-09-28: qty 8

## 2023-12-16 NOTE — Addendum Note (Signed)
 Encounter addended by: Nance Etha HERO, RT on: 12/16/2023 1:49 PM  Actions taken: Imaging Exam ended

## 2024-01-17 DEATH — deceased
